# Patient Record
Sex: Female | Born: 1985 | Marital: Married
Health system: Southern US, Community
[De-identification: ages and names within clinical notes are randomized; demographics above are authoritative.]

## PROBLEM LIST (undated history)

## (undated) DIAGNOSIS — F419 Anxiety disorder, unspecified: Secondary | ICD-10-CM

## (undated) DIAGNOSIS — J860 Pyothorax with fistula: Secondary | ICD-10-CM

## (undated) DIAGNOSIS — F909 Attention-deficit hyperactivity disorder, unspecified type: Secondary | ICD-10-CM

## (undated) DIAGNOSIS — A630 Anogenital (venereal) warts: Secondary | ICD-10-CM

## (undated) DIAGNOSIS — N189 Chronic kidney disease, unspecified: Secondary | ICD-10-CM

## (undated) DIAGNOSIS — R87629 Unspecified abnormal cytological findings in specimens from vagina: Secondary | ICD-10-CM

## (undated) DIAGNOSIS — I10 Essential (primary) hypertension: Secondary | ICD-10-CM

## (undated) DIAGNOSIS — F329 Major depressive disorder, single episode, unspecified: Secondary | ICD-10-CM

## (undated) DIAGNOSIS — O09299 Supervision of pregnancy with other poor reproductive or obstetric history, unspecified trimester: Secondary | ICD-10-CM

## (undated) DIAGNOSIS — F32A Depression, unspecified: Secondary | ICD-10-CM

## (undated) DIAGNOSIS — O1420 HELLP syndrome (HELLP), unspecified trimester: Secondary | ICD-10-CM

## (undated) DIAGNOSIS — T7840XA Allergy, unspecified, initial encounter: Secondary | ICD-10-CM

## (undated) DIAGNOSIS — O139 Gestational [pregnancy-induced] hypertension without significant proteinuria, unspecified trimester: Secondary | ICD-10-CM

## (undated) HISTORY — DX: Allergy, unspecified, initial encounter: T78.40XA

## (undated) HISTORY — DX: Pyothorax with fistula: J86.0

## (undated) HISTORY — PX: TRACHEOESOPHAGEAL FISTULA REPAIR: SHX2557

## (undated) HISTORY — PX: VASCULAR RING REPAIR: SHX2651

## (undated) HISTORY — DX: Anxiety disorder, unspecified: F41.9

## (undated) HISTORY — DX: Depression, unspecified: F32.A

## (undated) HISTORY — PX: TRACHEAL SURGERY: SHX1096

## (undated) HISTORY — PX: ESOPHAGUS SURGERY: SHX626

## (undated) HISTORY — DX: Major depressive disorder, single episode, unspecified: F32.9

---

## 2005-04-13 ENCOUNTER — Emergency Department (HOSPITAL_COMMUNITY): Admission: EM | Admit: 2005-04-13 | Discharge: 2005-04-13 | Payer: Self-pay | Admitting: Family Medicine

## 2011-02-27 ENCOUNTER — Ambulatory Visit (INDEPENDENT_AMBULATORY_CARE_PROVIDER_SITE_OTHER): Payer: PRIVATE HEALTH INSURANCE

## 2011-02-27 DIAGNOSIS — R1310 Dysphagia, unspecified: Secondary | ICD-10-CM

## 2011-05-15 ENCOUNTER — Ambulatory Visit: Payer: PRIVATE HEALTH INSURANCE | Admitting: Physician Assistant

## 2011-05-15 VITALS — BP 120/83 | HR 82 | Temp 98.1°F | Resp 16 | Ht 65.0 in | Wt 144.2 lb

## 2011-05-15 DIAGNOSIS — J069 Acute upper respiratory infection, unspecified: Secondary | ICD-10-CM

## 2011-05-15 DIAGNOSIS — R05 Cough: Secondary | ICD-10-CM

## 2011-05-15 DIAGNOSIS — R059 Cough, unspecified: Secondary | ICD-10-CM

## 2011-05-15 DIAGNOSIS — J029 Acute pharyngitis, unspecified: Secondary | ICD-10-CM

## 2011-05-15 LAB — POCT CBC
Granulocyte percent: 60 %G (ref 37–80)
Lymph, poc: 1.4 (ref 0.6–3.4)
MCHC: 32.5 g/dL (ref 31.8–35.4)
MCV: 87.4 fL (ref 80–97)
MID (cbc): 0.4 (ref 0–0.9)
POC Granulocyte: 2.7 (ref 2–6.9)
POC LYMPH PERCENT: 30.8 %L (ref 10–50)
Platelet Count, POC: 206 10*3/uL (ref 142–424)
RDW, POC: 12.5 %

## 2011-05-15 MED ORDER — IPRATROPIUM BROMIDE 0.03 % NA SOLN: 2.0000 | Freq: Two times a day (BID) | NASAL | Status: AC

## 2011-05-15 MED ORDER — GUAIFENESIN ER 1200 MG PO TB12: 1.0000 | ORAL_TABLET | Freq: Two times a day (BID) | ORAL | Status: DC | PRN

## 2011-05-15 MED ORDER — HYDROCOD POLST-CHLORPHEN POLST 10-8 MG/5ML PO LQCR: 5.0000 mL | Freq: Two times a day (BID) | ORAL | Status: DC | PRN

## 2011-05-15 MED ORDER — ALBUTEROL SULFATE HFA 108 (90 BASE) MCG/ACT IN AERS: 2.0000 | INHALATION_SPRAY | RESPIRATORY_TRACT | Status: AC | PRN

## 2011-05-15 NOTE — Patient Instructions (Signed)
Get LOTS of rest.  Drink at least 64 ounces of water daily.  Use ibuprofen &/or acetaminophen for the muscle aches, and sore throat.

## 2011-05-15 NOTE — Progress Notes (Signed)
  Subjective:    Patient ID: Angelica [name], female    DOB: 1985-10-10, 26 y.o.   MRN: [id number]  HPI  Illness began 05/13/11.  Had been in [place] visiting with sister over spring break.  Awoke feeling like she had a cold.  Then developed severe body aches, fatigue, skin sensitivity (just the blankets on me hurt).  A little less achey today.  Cough, sore throat and congestion.  No GU/GI symtoms except decreased appetite.  School is going really well, "I love it."  Review of Systems As above.    Objective:   Physical Exam  Vital signs noted. Well-developed, well nourished WF who is awake, alert and oriented, in NAD. HEENT: [place]/AT, PERRL, EOMI.  Sclera and conjunctiva are clear.  EAC are patent, TMs are normal in appearance. Nasal mucosa is pink and moist. OP is clear. Neck: supple, non-tender, no lymphadenopathey, thyromegaly. Heart: RRR, no murmur Lungs: soft wheezes throughout. Abdomen: normo-active bowel sounds, supple, non-tender, no mass or organomegaly. Extremities: no cyanosis, clubbing or edema. Skin: warm and dry without rash.  Results for orders placed in visit on 05/15/11  POCT INFLUENZA A/B      Component Value Range   Influenza A, POC Negative     Influenza B, POC Negative    POCT RAPID STREP A (OFFICE)      Component Value Range   Rapid Strep A Screen Negative  Negative   POCT CBC      Component Value Range   WBC 4.5 (*) 4.6 - 10.2 (K/uL)   Lymph, poc 1.4  0.6 - 3.4    POC LYMPH PERCENT 30.8  10 - 50 (%L)   MID (cbc) 0.4  0 - 0.9    POC MID % 9.2  0 - 12 (%M)   POC Granulocyte 2.7  2 - 6.9    Granulocyte percent 60.0  37 - 80 (%G)   RBC 5.10  4.04 - 5.48 (M/uL)   Hemoglobin 14.5  12.2 - 16.2 (g/dL)   HCT, POC 10.2  72.5 - 47.9 (%)   MCV 87.4  80 - 97 (fL)   MCH, POC 28.4  27 - 31.2 (pg)   MCHC 32.5  31.8 - 35.4 (g/dL)   RDW, POC 36.6     Platelet Count, POC 206  142 - 424 (K/uL)   MPV 9.2  0 - 99.8 (fL)        Assessment & Plan:  Pharyngitis,  cough, wheezing. Likely viral illness, likely flu, despite negative test.  Supportive, symptomatic care. Anticipatory guidance provided. Mucinex, Atrovent, albuterol inhaler, Tussionex.  Out of school today.

## 2012-05-12 ENCOUNTER — Ambulatory Visit (INDEPENDENT_AMBULATORY_CARE_PROVIDER_SITE_OTHER): Payer: BC Managed Care – PPO | Admitting: Emergency Medicine

## 2012-05-12 VITALS — BP 144/100 | HR 98 | Temp 98.2°F | Resp 16 | Ht 65.0 in | Wt 139.0 lb

## 2012-05-12 DIAGNOSIS — K047 Periapical abscess without sinus: Secondary | ICD-10-CM

## 2012-05-12 MED ORDER — PENICILLIN V POTASSIUM 500 MG PO TABS: 500.0000 mg | ORAL_TABLET | Freq: Four times a day (QID) | ORAL | Status: DC

## 2012-05-12 MED ORDER — HYDROCODONE-ACETAMINOPHEN 5-325 MG PO TABS: 1.0000 | ORAL_TABLET | ORAL | Status: DC | PRN

## 2012-05-12 NOTE — Patient Instructions (Addendum)
Dental Caries   Tooth decay (dental caries, cavities) is the most common of all oral diseases. It occurs in all ages but is more common in children and young adults.   CAUSES   Bacteria in your mouth combine with foods (particularly sugars and starches) to produce plaque. Plaque is a substance that sticks to the hard surfaces of teeth. The bacteria in the plaque produce acids that attack the enamel of teeth. Repeated acid attacks dissolve the enamel and create holes in the teeth. Root surfaces of teeth may also get these holes.   Other contributing factors include:    Frequent snacking and drinking of cavity-producing foods and liquids.   Poor oral hygiene.   Dry mouth.   Substance abuse such as methamphetamine.   Broken or poor fitting dental restorations.   Eating disorders.   Gastroesophageal reflux disease (GERD).   Certain radiation treatments to the head and neck.  SYMPTOMS   At first, dental decay appears as white, chalky areas on the enamel. In this early stage, symptoms are seldom present. As the decay progresses, pits and holes may appear on the enamel surfaces. Progression of the decay will lead to softening of the hard layers of the tooth. At this point you may experience some pain or achy feeling after sweet, hot, or cold foods or drinks are consumed. If left untreated, the decay will reach the internal structures of the tooth and produce severe pain. Extensive dental treatment, such as root canal therapy, may be needed to save the tooth at this late stage of decay development.   DIAGNOSIS   Most cavities will be detected during regular check-ups. A thorough medical and dental history will be taken by the dentist. The dentist will use instruments to check the surfaces of your teeth for any breakdown or discoloration. Some dentists have special instruments, such as lasers, that detect tooth decay. Dental X-rays may also show some cavities that are not visible to the eye (such as between the  contact areas of the teeth).  TREATMENT   Treatment involves removal of the tooth decay and replacement with a restorative material such as silver, gold, or composite (white) material. However, if the decay involves a large area of the tooth and there is little remaining healthy tooth structure, a cap (crown) will be fitted over the remaining structure. If the decay involves the center part of the tooth (pulp), root canal treatment will be needed before any type of dental restoration is placed. If the tooth is severely destroyed by the decay process, leaving the remaining tooth structures unrestorable, the tooth will need to be pulled (extracted). Some early tooth decay may be reversed by fluoride treatments and thorough brushing and flossing at home.  PREVENTION    Eat healthy foods. Restrict the amount of sugary, starchy foods and liquids you consume. Avoid frequent snacking and drinking of unhealthy foods and liquids.   Sealants can help with prevention of cavities. Sealants are composite resins applied onto the biting surfaces of teeth at risk for decay. They smooth out the pits and grooves and prevent food from being trapped in them. This is done in early childhood before tooth decay has started.   Fluoride tablets may also be prescribed to children between 6 months and 10 years of age if your drinking water is not fluoridated. The fluoride absorbed by the tooth enamel makes teeth less susceptible to decay. Thorough daily cleaning with a toothbrush and dental floss is the best way to prevent   cavities. Use of a fluoride toothpaste is highly recommended. Fluoride mouth rinses may be used in specific cases.   Topical application of fluoride by your dentist is important in children.   Regular visits with a dentist for checkups and cleanings are also important.  SEEK IMMEDIATE DENTAL CARE IF:   You have a fever.   You develop redness and swelling of your face, jaw, or neck.   You develop swelling around a  tooth.   You are unable to open your mouth or cannot swallow.   You have severe pain uncontrolled by pain medicine.  Document Released: 11/09/2001 Document Revised: 05/12/2011 Document Reviewed: 07/25/2010  ExitCare Patient Information 2013 [employer].

## 2012-05-12 NOTE — Progress Notes (Signed)
[facility] [address], [place] [address] [phone]  Date:  05/12/2012   Name:  Siniya Lichty   DOB:  November 27, 1985   MRN:  [id number]  PCP:  No primary provider on file.    Chief Complaint: Dental Pain   History of Present Illness:  Angelica [name] is a 27 y.o. very pleasant female patient who presents with the following:  Says that a filling fell out and she has been having very significant pain in her right mandibular molar when exposed to hot or cold drinks or attempting to eat. Denies facial cellulitis or swelling.  No fever or chills.  Denies injury.  Has an appt with dentist Monday.  There is no problem list on file for this patient.   Past Medical History  Diagnosis Date  . Allergy   . Depression   . Anxiety     Past Surgical History  Procedure Laterality Date  . Esophagus surgery    . Tracheal surgery      History  Substance Use Topics  . Smoking status: Former Games developer  . Smokeless tobacco: Never Used  . Alcohol Use: 1.8 oz/week    3 Glasses of wine per week    Family History  Problem Relation Age of Onset  . Diabetes Maternal Grandfather   . Stroke Paternal Grandmother     Allergies  Allergen Reactions  . Latex Anaphylaxis and Hives    Medication list has been reviewed and updated.  Current Outpatient Prescriptions on File Prior to Visit  Medication Sig Dispense Refill  . ALPRAZolam (XANAX) 0.5 MG tablet Take 0.5 mg by mouth at bedtime as needed.      Marland Kitchen lisdexamfetamine (VYVANSE) 30 MG capsule Take 30 mg by mouth every morning.      . lithium carbonate 300 MG capsule Take 300 mg by mouth 3 (three) times daily with meals.      . sertraline (ZOLOFT) 100 MG tablet Take 100 mg by mouth daily. 1 1/2 tablets per day      . albuterol (PROVENTIL HFA;VENTOLIN HFA) 108 (90 BASE) MCG/ACT inhaler Inhale 2 puffs into the lungs every 4 (four) hours as needed for wheezing (cough, shortness of breath or wheezing.).  1 Inhaler  1   . amphetamine-dextroamphetamine (ADDERALL) 10 MG tablet Take 10 mg by mouth daily as needed.       . chlorpheniramine-HYDROcodone (TUSSIONEX PENNKINETIC ER) 10-8 MG/5ML LQCR Take 5 mLs by mouth every 12 (twelve) hours as needed (cough).  60 mL  0  . Guaifenesin (MUCINEX MAXIMUM STRENGTH) 1200 MG TB12 Take 1 tablet (1,200 mg total) by mouth every 12 (twelve) hours as needed.  14 tablet  1  . ipratropium (ATROVENT) 0.03 % nasal spray Place 2 sprays into the nose 2 (two) times daily.  30 mL  0   No current facility-administered medications on file prior to visit.    Review of Systems:  As per HPI, otherwise negative.    Physical Examination: Filed Vitals:   05/12/12 1609  BP: 144/100  Pulse: 98  Temp: 98.2 F (36.8 C)  Resp: 16   Filed Vitals:   05/12/12 1609  Height: 5\' 5"  (1.651 m)  Weight: 139 lb (63.05 kg)   Body mass index is 23.13 kg/(m^2). Ideal Body Weight: Weight in (lb) to have BMI = 25: 149.9   GEN: WDWN, NAD, Non-toxic, Alert & Oriented x 3 HEENT: Atraumatic, Normocephalic.  Ears and Nose: No external deformity. EXTR: No clubbing/cyanosis/edema NEURO:  Normal gait.  PSYCH: Normally interactive. Conversant. Not depressed or anxious appearing.  Calm demeanor.  MOUTH:  Missing filling # 30.  No cellulitis or gingivitis  Assessment and Plan: Periapical abscess Pen v k vicodin  Carmelina Dane, MD

## 2013-01-31 HISTORY — PX: DILATION AND CURETTAGE OF UTERUS: SHX78

## 2013-02-14 ENCOUNTER — Ambulatory Visit: Payer: BC Managed Care – PPO | Admitting: Physician Assistant

## 2013-02-14 VITALS — BP 120/80 | HR 90 | Temp 98.3°F | Ht 65.0 in | Wt 157.2 lb

## 2013-02-14 DIAGNOSIS — F32A Depression, unspecified: Secondary | ICD-10-CM | POA: Insufficient documentation

## 2013-02-14 DIAGNOSIS — F329 Major depressive disorder, single episode, unspecified: Secondary | ICD-10-CM | POA: Insufficient documentation

## 2013-02-14 DIAGNOSIS — F988 Other specified behavioral and emotional disorders with onset usually occurring in childhood and adolescence: Secondary | ICD-10-CM | POA: Insufficient documentation

## 2013-02-14 DIAGNOSIS — F341 Dysthymic disorder: Secondary | ICD-10-CM

## 2013-02-14 DIAGNOSIS — J309 Allergic rhinitis, unspecified: Secondary | ICD-10-CM | POA: Insufficient documentation

## 2013-02-14 MED ORDER — SERTRALINE HCL 100 MG PO TABS: 200.0000 mg | ORAL_TABLET | Freq: Every day | ORAL | Status: DC

## 2013-02-14 NOTE — Progress Notes (Signed)
   Subjective:    Patient ID: Angelica [name], female    DOB: 06-26-1985, 27 y.o.   MRN: [id number]  PCP: No PCP Per Patient  Chief Complaint  Patient presents with  . Medication Refill    need Zoloft 200 mg   Medications, allergies, past medical history, surgical history, family history, social history and problem list reviewed and updated.  HPI  "It's going really really well."  Is happy with school and relationships. No thoughts of harming herself or others.  Is seeing a specialist at [facility] (Dr. Jennelle Human), but she'll run out of her prescription before she gets seen there, and they won't refill the prescription without an OV.  I haven't seen her since 05/2011.  She terminated an unplanned pregnancy about 2 weeks ago.  Has a follow-up US later this week and then will resume coc for pregnancy prevention.  Review of Systems     Objective:   Physical Exam Blood pressure 120/80, pulse 90, temperature 98.3 F (36.8 C), temperature source Oral, height 5\' 5"  (1.651 m), weight 157 lb 3.2 oz (71.305 kg), SpO2 100.00%. Body mass index is 26.16 kg/(m^2). Well-developed, well nourished WF who is awake, alert and oriented, in NAD. HEENT: [place]/AT, sclera and conjunctiva are clear.   Neck: supple, non-tender, no lymphadenopathy, thyromegaly. Heart: RRR, no murmur Lungs: normal effort, CTA Skin: warm and dry without rash. Psychologic: good mood and appropriate affect, normal speech and behavior.        Assessment & Plan:  1. Anxiety and depression Stable.  Continue current treatment and follow-up with Dr. Jennelle Human as planned. - sertraline (ZOLOFT) 100 MG tablet; Take 2 tablets (200 mg total) by mouth daily.  Dispense: 60 tablet; Refill: 2   Fernande Bras, PA-C Physician Assistant-Certified [facility]

## 2013-02-14 NOTE — Patient Instructions (Signed)
Follow-up with Dr. Jennelle Human as planned.  Consider reading Attention Deficit Disorder: The Unfocused Mind in Children and Adults by Berneta Levins

## 2015-01-08 LAB — HEPATIC FUNCTION PANEL
ALK PHOS: 57 U/L (ref 25–125)
ALT: 12 U/L (ref 7–35)
AST: 14 U/L (ref 13–35)
Bilirubin, Total: 0.5 mg/dL

## 2015-01-08 LAB — TSH: TSH: 1.31 u[IU]/mL (ref 0.41–5.90)

## 2015-01-08 LAB — CBC AND DIFFERENTIAL
HCT: 41 % (ref 36–46)
HEMOGLOBIN: 14.4 g/dL (ref 12.0–16.0)
Neutrophils Absolute: 6 /uL
PLATELETS: 218 10*3/uL (ref 150–399)
WBC: 7.7 10^3/mL

## 2015-01-08 LAB — BASIC METABOLIC PANEL
BUN: 12 mg/dL (ref 4–21)
CREATININE: 1.2 mg/dL — AB (ref 0.5–1.1)
Glucose: 103 mg/dL
Potassium: 3.8 mmol/L (ref 3.4–5.3)
SODIUM: 141 mmol/L (ref 137–147)

## 2015-01-08 LAB — HEMOGLOBIN A1C: Hgb A1c MFr Bld: 5.2 % (ref 4.0–6.0)

## 2015-01-08 LAB — HM PAP SMEAR: HM Pap smear: NEGATIVE

## 2015-02-06 ENCOUNTER — Ambulatory Visit (INDEPENDENT_AMBULATORY_CARE_PROVIDER_SITE_OTHER): Payer: BLUE CROSS/BLUE SHIELD | Admitting: Physician Assistant

## 2015-02-06 ENCOUNTER — Encounter: Payer: Self-pay | Admitting: Physician Assistant

## 2015-02-06 VITALS — BP 90/70 | HR 103 | Temp 99.3°F | Resp 16 | Ht 65.0 in | Wt 161.6 lb

## 2015-02-06 DIAGNOSIS — Z23 Encounter for immunization: Secondary | ICD-10-CM

## 2015-02-06 DIAGNOSIS — Z711 Person with feared health complaint in whom no diagnosis is made: Secondary | ICD-10-CM

## 2015-02-06 DIAGNOSIS — Z283 Underimmunization status: Secondary | ICD-10-CM

## 2015-02-06 NOTE — Progress Notes (Signed)
Patient ID: Angelica [name], female    DOB: 1985-11-13, 29 y.o.   MRN: [id number]  PCP: No PCP Per Patient  Subjective:   Chief Complaint  Patient presents with  . Follow-up  . labs from GYN    HPI Presents for evaluation of lab results performed at GYN. I last saw her 24 months ago. She is accompanied by her sister today, who is visiting before moving to [place], [place].  She is particularly concerned that the serum creatinine was elevated (1.19) and that she may have kidney failure.  Labs reviewed together and abstracted into her record for future comparison. Originals returned to her.  Other than terrible anxiety about the kidney function, she feels well. She is very happy with her psychiatrist. Sees a therapist intermittently.  She is engaged to be married in May. Currently lives with her fiance. Rather than a big wedding, they are saving money to buy a home, and the wedding planning was too stressful. 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   Review of Systems As above.    Patient Active Problem List   Diagnosis Date Noted  . Anxiety and depression 02/14/2013  . ADD (attention deficit disorder) 02/14/2013  . Allergic rhinitis 02/14/2013     Prior to Admission medications   Medication Sig Start Date End Date Taking? Authorizing Provider  busPIRone (BUSPAR) 15 MG tablet Take 15 mg by mouth 3 (three) times daily.   Yes Historical Provider, MD  cloNIDine (CATAPRES) 0.2 MG tablet Take 0.2 mg by mouth 2 (two) times daily.   Yes Historical Provider, MD  dexmethylphenidate (FOCALIN) 10 MG tablet Take 10 mg by mouth 2 (two) times daily.   Yes Historical Provider, MD  Dexmethylphenidate HCl 40 MG CP24 Take by mouth.   Yes Historical Provider, MD  folic acid (FOLVITE) 1 MG tablet Take 1 mg by mouth daily.   Yes Historical Provider, MD  levonorgestrel  (MIRENA) 20 MCG/24HR IUD 1 each by Intrauterine route once.   Yes Historical Provider, MD  lithium carbonate 300 MG capsule Take 300 mg by mouth 3 (three) times daily with meals.   Yes Historical Provider, MD  LORazepam (ATIVAN) 0.5 MG tablet Take 0.5 mg by mouth every 8 (eight) hours.   Yes Historical Provider, MD  sertraline (ZOLOFT) 100 MG tablet Take 2 tablets (200 mg total) by mouth daily. 02/14/13  Yes Trica Usery, PA-C  ALPRAZolam (XANAX) 0.5 MG tablet Take 0.5 mg by mouth at bedtime as needed.    Historical Provider, MD  desogestrel-ethinyl estradiol (KARIVA,AZURETTE,MIRCETTE) 0.15-0.02/0.01 MG (21/5) tablet Take 1 tablet by mouth daily.    Historical Provider, MD  lisdexamfetamine (VYVANSE) 30 MG capsule Take 30 mg by mouth every morning.    Historical Provider, MD     Allergies  Allergen Reactions  . Latex Anaphylaxis and Hives       Objective:  Physical Exam  Constitutional: She is oriented to person, place, and time. Vital signs are normal. She appears well-developed and well-nourished. She is active and cooperative. No distress.  BP 90/70 mmHg  Pulse 103  Temp(Src) 99.3 F (37.4 C) (Oral)  Resp 16  Ht 5\' 5"  (1.651 m)  Wt 161 lb 9.6 oz (73.301 kg)  BMI 26.89 kg/m2  SpO2 98%  LMP 01/31/2015  HENT:  Head: Normocephalic and atraumatic.  Right Ear: Hearing normal.  Left Ear: Hearing normal.  Eyes: Conjunctivae are normal. No scleral icterus.  Neck: Normal range of motion. Neck supple. No thyromegaly present.  Cardiovascular: Normal rate, regular rhythm and normal heart sounds.   Pulses:      Radial pulses are 2+ on the right side, and 2+ on the left side.  Pulmonary/Chest: Effort normal and breath sounds normal.  Lymphadenopathy:       Head (right side): No tonsillar, no preauricular, no posterior auricular and no occipital adenopathy present.       Head (left side): No tonsillar, no preauricular, no posterior auricular and no occipital adenopathy present.    She  has no cervical adenopathy.       Right: No supraclavicular adenopathy present.       Left: No supraclavicular adenopathy present.  Neurological: She is alert and oriented to person, place, and time. No sensory deficit.  Skin: Skin is warm, dry and intact. No rash noted. No cyanosis or erythema. Nails show no clubbing.  Psychiatric: Her speech is normal and behavior is normal. Judgment and thought content normal. Her mood appears anxious. Her affect is not angry, not blunt, not labile and not inappropriate. Cognition and memory are normal. She does not exhibit a depressed mood.  Initially in tears with anxiety over the abnormal lab results. Later is mildly anxious, appropriate, and smiling.           Assessment & Plan:   1. Worried well Discussed that the other measures of renal function are normal and that this mild elevation, alone, is not worrisome. We agree to repeat it in 6-12 months, sooner if she develops any new symptoms or begins any new medications that are renally excreted.  2. Need for Tdap vaccination - Tdap vaccine greater than or equal to 7yo IM  3. Need for influenza vaccination Initially declined, then agreed, then declined again. Encouraged her to reconsider.   Fara Chute, PA-C Physician Assistant-Certified [facility]

## 2015-02-14 ENCOUNTER — Encounter: Payer: Self-pay | Admitting: Physician Assistant

## 2015-02-14 DIAGNOSIS — Z789 Other specified health status: Secondary | ICD-10-CM | POA: Insufficient documentation

## 2015-11-16 ENCOUNTER — Telehealth: Payer: Self-pay | Admitting: Emergency Medicine

## 2015-11-16 NOTE — Telephone Encounter (Signed)
Spoke with pts Father in law, Dr Ron Parker, pt is having issue with high BP and would like to get established with Dr Quay Burow. LVM for pt to call back to schedule appt. Okay with Dr Quay Burow to work pt in.

## 2015-11-27 ENCOUNTER — Encounter: Payer: Self-pay | Admitting: Cardiology

## 2015-11-27 ENCOUNTER — Encounter: Payer: Self-pay | Admitting: Internal Medicine

## 2015-11-27 ENCOUNTER — Ambulatory Visit (INDEPENDENT_AMBULATORY_CARE_PROVIDER_SITE_OTHER): Payer: 59 | Admitting: Internal Medicine

## 2015-11-27 VITALS — BP 152/100 | HR 91 | Temp 98.3°F | Resp 16 | Wt 174.0 lb

## 2015-11-27 DIAGNOSIS — F419 Anxiety disorder, unspecified: Secondary | ICD-10-CM

## 2015-11-27 DIAGNOSIS — R030 Elevated blood-pressure reading, without diagnosis of hypertension: Secondary | ICD-10-CM

## 2015-11-27 DIAGNOSIS — IMO0001 Reserved for inherently not codable concepts without codable children: Secondary | ICD-10-CM

## 2015-11-27 DIAGNOSIS — F418 Other specified anxiety disorders: Secondary | ICD-10-CM | POA: Diagnosis not present

## 2015-11-27 DIAGNOSIS — F329 Major depressive disorder, single episode, unspecified: Secondary | ICD-10-CM

## 2015-11-27 DIAGNOSIS — F988 Other specified behavioral and emotional disorders with onset usually occurring in childhood and adolescence: Secondary | ICD-10-CM

## 2015-11-27 DIAGNOSIS — F909 Attention-deficit hyperactivity disorder, unspecified type: Secondary | ICD-10-CM | POA: Diagnosis not present

## 2015-11-27 MED ORDER — AMPHETAMINE-DEXTROAMPHETAMINE 10 MG PO TABS: ORAL_TABLET | ORAL | 0 refills | Status: DC

## 2015-11-27 MED ORDER — FOLIC ACID 1 MG PO TABS: 1.0000 mg | ORAL_TABLET | Freq: Every day | ORAL | 3 refills | Status: DC

## 2015-11-27 NOTE — Patient Instructions (Addendum)
  Test(s) ordered today. Your results will be released to MyChart (or called to you) after review, usually within 72hours after test completion. If any changes need to be made, you will be notified at that same time.   Medications reviewed and updated.  No changes recommended at this time.    Please followup in 4 weeks   

## 2015-11-27 NOTE — Assessment & Plan Note (Signed)
Management per psych Medications being revised so that she can get pregnant in the spring

## 2015-11-27 NOTE — Assessment & Plan Note (Signed)
BP consistently elevated Discussed concern with elevated BP - her BP has not been elevated long term so I think it is reasonable to rule out other causes and work on lifestyle before starting medication Start regular exercise, work on weight loss, low sodium diet Monitor BP at home Check labs Follow up in 4 weeks, sooner if needed

## 2015-11-27 NOTE — Progress Notes (Signed)
Patient received education resource, including the self-management goal and tool. Patient verbalized understanding. 

## 2015-11-27 NOTE — Assessment & Plan Note (Signed)
Management per psych Discussed concern that the Adderall may be causing her elevated BP - will discuss with psych

## 2015-11-27 NOTE — Progress Notes (Signed)
Subjective:    Patient ID: EDONA RINEY, female    DOB: 11-May-1985, 30 y.o.   MRN: [id number]  HPI She is here to establish with a new pcp.    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    She drinks two glasses of wine a night.  Initially she was going this as self medication for her anxiety and ADD, but the medication she is on now is helping and she has decreased her alcohol intake.  She does not feel she is drinking to relax now or self medicate.    She does not exercise - she hates exercise.  She does go to the gym sometimes.  Once she gets to the gym is she ok, but she does not have it in her routine.   Elevated blood pressure:  She sees her psychiatrist noticed her BP was elevated and her husband has been monitoring it at home.  It has been consistently high.  She has tried to decrease the salt in her diet.  Her BP at home 110-138/80-108.  She has gained weight and knows she needs to lose weight.   Medications and allergies reviewed with patient and updated if appropriate.  Patient Active Problem List   Diagnosis Date Noted  . Immune to hepatitis B 02/14/2015  . Anxiety and depression 02/14/2013  . ADD (attention deficit disorder) 02/14/2013  . Allergic rhinitis 02/14/2013    Current Outpatient Prescriptions on File Prior to Visit  Medication Sig Dispense Refill  . busPIRone (BUSPAR) 15 MG tablet Take 15 mg by mouth 3 (three) times daily.    . cloNIDine (CATAPRES) 0.2 MG tablet Take 0.2 mg by mouth 2 (two) times daily.    . folic acid (FOLVITE) 1 MG tablet Take 1 mg by mouth daily.    Marland Kitchen levonorgestrel (MIRENA) 20 MCG/24HR IUD 1 each by Intrauterine route once.    Marland Kitchen LORazepam (ATIVAN) 0.5 MG tablet Take 0.5 mg by mouth every 8 (eight) hours.    . sertraline (ZOLOFT) 100 MG tablet Take 2 tablets (200 mg total) by mouth daily. 60 tablet 2   No current facility-administered medications on file  prior to visit.     Past Medical History:  Diagnosis Date  . Allergy   . Anxiety   . Depression   . Tracheoesophageal fistula [facility])     Past Surgical History:  Procedure Laterality Date  . DILATION AND CURETTAGE OF UTERUS  01/2013  . ESOPHAGUS SURGERY    . TRACHEAL SURGERY      Social History   Social History  . Marital status: Single    Spouse name: n/a  . Number of children: 0  . Years of education: Master's   Occupational History  . 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 [employer]     private practice   Social History Main Topics  . Smoking status: Former Research scientist (life sciences)  . Smokeless tobacco: Never Used  . Alcohol use 3.0 - 6.0 oz/week    5 - 10 Standard drinks or equivalent per week     Comment: "more than I should, it's a stupid coping mechanism."  . Drug use: No  . Sexual activity: Yes    Partners: Male    Birth control/ protection: Pill   Other Topics Concern  . None   Social History Narrative   Graduated from [employer]  2008.   Lives with her fiance.   Engaged to be married in May 2017.          Family History  Problem Relation Age of Onset  . Diabetes Maternal Grandfather   . Stroke Paternal Grandmother   . Hyperlipidemia Paternal Grandmother   . Mental illness Mother   . Mental illness Sister   . Cancer Maternal Grandmother     Review of Systems  Constitutional: Negative for fever.  HENT: Positive for trouble swallowing (rare if she does not chew enough).   Eyes: Negative for visual disturbance.  Respiratory: Negative for cough, shortness of breath and wheezing.   Cardiovascular: Positive for chest pain (anxiety only) and palpitations (anxiety related). Negative for leg swelling.  Gastrointestinal: Negative for abdominal pain and nausea.  Endocrine: Negative for polydipsia and polyuria.  Genitourinary: Negative for dysuria and hematuria.  Neurological: Negative for dizziness, light-headedness and  headaches.  Psychiatric/Behavioral: Positive for dysphoric mood. The patient is nervous/anxious.        Objective:   Vitals:   11/27/15 1609  BP: (!) 152/100  Pulse: 91  Resp: 16  Temp: 98.3 F (36.8 C)   Filed Weights   11/27/15 1609  Weight: 174 lb (78.9 kg)   Body mass index is 28.96 kg/m.   Physical Exam Constitutional: She appears well-developed and well-nourished. No distress.  HENT:  Head: Normocephalic and atraumatic.  Right Ear: External ear normal. Left Ear: External ear normal.   Mouth/Throat: Oropharynx is clear and moist.  Eyes: Conjunctivae normal.  Neck: Neck supple. No tracheal deviation present. No thyromegaly present.  No carotid bruit  Cardiovascular: Normal rate, regular rhythm and normal heart sounds.   No murmur heard.  No edema. Pulmonary/Chest: Effort normal and breath sounds normal. No respiratory distress. She has no wheezes. She has no rales.  Abdominal: Soft. She exhibits no distension. There is no tenderness.  Lymphadenopathy: She has no cervical adenopathy.  Skin: Skin is warm and dry. She is not diaphoretic.  Psychiatric: She has a normal mood and affect. Her behavior is normal.      Assessment & Plan:   See Problem List for Assessment and Plan of chronic medical problems.   Follow up in 4 weeks

## 2015-12-05 ENCOUNTER — Ambulatory Visit: Payer: Self-pay | Admitting: Nurse Practitioner

## 2015-12-23 NOTE — Progress Notes (Signed)
Subjective:    Patient ID: Angelica [name], female    DOB: June 10, 1985, 30 y.o.   MRN: [id number]  HPI She is here for follow up.  She has been eating very healthy.  She has decreased her salt intake.  She has lost 5 lbs since she was here last.  She is not exercising regularly, but not regularly. She is active with yard work.   Elevated blood pressure:  Her husband has been checking her BP (he is a  Audiological scientist) - it was normal prior to coffee and Adderall.  She is unsure if it is elevated after taking the Adderall.  She thinks it has been high on other ADD medications.  She feels she still needs the Adderall.  She plans on coming off eventually because she still wants to get pregnant next year.   She has not decreased her alcohol intake.  She still drinks 2 glasses of wine a night.   She has not taken the ativan in a while and feels her anxiety is ok right now.     Medications and allergies reviewed with patient and updated if appropriate.  Patient Active Problem List   Diagnosis Date Noted  . Elevated blood pressure 11/27/2015  . Immune to hepatitis B 02/14/2015  . Anxiety and depression 02/14/2013  . ADD (attention deficit disorder) 02/14/2013  . Allergic rhinitis 02/14/2013    Current Outpatient Prescriptions on File Prior to Visit  Medication Sig Dispense Refill  . amphetamine-dextroamphetamine (ADDERALL) 10 MG tablet Adderall 30 mg in morning and 10-20 mg in afternoon 60 tablet 0  . busPIRone (BUSPAR) 15 MG tablet Take 15 mg by mouth 3 (three) times daily.    . cloNIDine (CATAPRES) 0.2 MG tablet Take 0.2 mg by mouth 2 (two) times daily.    . folic acid (FOLVITE) 1 MG tablet Take 1 tablet (1 mg total) by mouth daily. 90 tablet 3  . levonorgestrel (MIRENA) 20 MCG/24HR IUD 1 each by Intrauterine route once.    Marland Kitchen LORazepam (ATIVAN) 0.5 MG tablet Take 0.5 mg by mouth every 8 (eight) hours.    . sertraline (ZOLOFT) 100 MG tablet Take 2 tablets (200 mg total) by mouth daily.  60 tablet 2   No current facility-administered medications on file prior to visit.     Past Medical History:  Diagnosis Date  . Allergy   . Anxiety   . Depression   . Tracheoesophageal fistula [facility])     Past Surgical History:  Procedure Laterality Date  . DILATION AND CURETTAGE OF UTERUS  01/2013  . ESOPHAGUS SURGERY    . TRACHEAL SURGERY      Social History   Social History  . Marital status: Single    Spouse name: n/a  . Number of children: 0  . Years of education: Master's   Occupational History  . 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 [employer]     private practice   Social History Main Topics  . Smoking status: Former Research scientist (life sciences)  . Smokeless tobacco: Never Used  . Alcohol use 3.0 - 6.0 oz/week    5 - 10 Standard drinks or equivalent per week     Comment: "more than I should, it's a stupid coping mechanism."  . Drug use: No  . Sexual activity: Yes    Partners: Male    Birth control/ protection: Pill   Other Topics Concern  . Not on file  Social History Narrative   Graduated from [employer] 2008.   Lives with her fiance.   Engaged to be married in May 2017.          Family History  Problem Relation Age of Onset  . Diabetes Maternal Grandfather   . Stroke Paternal Grandmother   . Hyperlipidemia Paternal Grandmother   . Mental illness Mother   . Mental illness Sister   . Cancer Maternal Grandmother     Review of Systems  Constitutional: Negative for diaphoresis and fever.       Energy level fluctuates, slightly improved  Respiratory: Negative for shortness of breath.   Cardiovascular: Negative for chest pain, palpitations and leg swelling.  Neurological: Negative for light-headedness and headaches.  Psychiatric/Behavioral: The patient is nervous/anxious.        Objective:   Vitals:   12/24/15 0929  BP: 124/82  Pulse: 76  Resp: 16  Temp: 98.5 F (36.9 C)   Filed Weights   12/24/15 0929    Weight: 169 lb (76.7 kg)   Body mass index is 28.12 kg/m.   Physical Exam Constitutional: Appears well-developed and well-nourished. No distress.  HENT:  Head: Normocephalic and atraumatic.  Neck: Neck supple. No tracheal deviation present. No thyromegaly present.  No cervical lymphadenopathy Cardiovascular: Normal rate, regular rhythm and normal heart sounds.   1/6 systolic murmur left sternal border with radiation to carotid. No carotid bruit .  No edema Pulmonary/Chest: Effort normal and breath sounds normal. No respiratory distress. No has no wheezes. No rales.  Skin: Skin is warm and dry. Not diaphoretic.  Psychiatric: slightly anxious mood and affect. Behavior is normal.       Assessment & Plan:   See Problem List for Assessment and Plan of chronic medical problems.

## 2015-12-24 ENCOUNTER — Encounter: Payer: Self-pay | Admitting: Internal Medicine

## 2015-12-24 ENCOUNTER — Other Ambulatory Visit (INDEPENDENT_AMBULATORY_CARE_PROVIDER_SITE_OTHER): Payer: 59

## 2015-12-24 ENCOUNTER — Ambulatory Visit (INDEPENDENT_AMBULATORY_CARE_PROVIDER_SITE_OTHER): Payer: 59 | Admitting: Internal Medicine

## 2015-12-24 VITALS — BP 124/82 | HR 76 | Temp 98.5°F | Resp 16 | Ht 65.0 in | Wt 169.0 lb

## 2015-12-24 DIAGNOSIS — IMO0001 Reserved for inherently not codable concepts without codable children: Secondary | ICD-10-CM

## 2015-12-24 DIAGNOSIS — R011 Cardiac murmur, unspecified: Secondary | ICD-10-CM

## 2015-12-24 DIAGNOSIS — R030 Elevated blood-pressure reading, without diagnosis of hypertension: Secondary | ICD-10-CM

## 2015-12-24 DIAGNOSIS — Z23 Encounter for immunization: Secondary | ICD-10-CM | POA: Diagnosis not present

## 2015-12-24 LAB — COMPREHENSIVE METABOLIC PANEL
ALBUMIN: 4.6 g/dL (ref 3.5–5.2)
ALT: 12 U/L (ref 0–35)
AST: 16 U/L (ref 0–37)
Alkaline Phosphatase: 56 U/L (ref 39–117)
BUN: 14 mg/dL (ref 6–23)
CALCIUM: 9.8 mg/dL (ref 8.4–10.5)
CHLORIDE: 108 meq/L (ref 96–112)
CO2: 26 meq/L (ref 19–32)
Creatinine, Ser: 1.03 mg/dL (ref 0.40–1.20)
GFR: 66.26 mL/min (ref >60.00)
Glucose, Bld: 88 mg/dL (ref 70–99)
POTASSIUM: 4.5 meq/L (ref 3.5–5.1)
Sodium: 140 mEq/L (ref 135–145)
Total Bilirubin: 0.6 mg/dL (ref 0.2–1.2)
Total Protein: 7.1 g/dL (ref 6.0–8.3)

## 2015-12-24 LAB — CBC WITH DIFFERENTIAL/PLATELET
BASOS PCT: 0.4 % (ref 0.0–3.0)
Basophils Absolute: 0 10*3/uL (ref 0.0–0.1)
EOS PCT: 3.6 % (ref 0.0–5.0)
Eosinophils Absolute: 0.2 10*3/uL (ref 0.0–0.7)
HEMATOCRIT: 44 % (ref 36.0–46.0)
HEMOGLOBIN: 15.2 g/dL — AB (ref 12.0–15.0)
Lymphocytes Relative: 28.8 % (ref 12.0–46.0)
Lymphs Abs: 1.7 10*3/uL (ref 0.7–4.0)
MCHC: 34.6 g/dL (ref 30.0–36.0)
MCV: 85.6 fl (ref 78.0–100.0)
MONO ABS: 0.3 10*3/uL (ref 0.1–1.0)
Monocytes Relative: 5.6 % (ref 3.0–12.0)
NEUTROS ABS: 3.6 10*3/uL (ref 1.4–7.7)
Neutrophils Relative %: 61.6 % (ref 43.0–77.0)
PLATELETS: 192 10*3/uL (ref 150.0–400.0)
RBC: 5.14 Mil/uL — ABNORMAL HIGH (ref 3.87–5.11)
RDW: 12.2 % (ref 11.5–15.5)
WBC: 5.9 10*3/uL (ref 4.0–10.5)

## 2015-12-24 LAB — LIPID PANEL
CHOL/HDL RATIO: 2
CHOLESTEROL: 158 mg/dL (ref 0–200)
HDL: 65.3 mg/dL (ref >39.00)
LDL CALC: 77 mg/dL (ref 0–99)
NonHDL: 92.66
TRIGLYCERIDES: 78 mg/dL (ref 0.0–149.0)
VLDL: 15.6 mg/dL (ref 0.0–40.0)

## 2015-12-24 LAB — CORTISOL: Cortisol, Plasma: 6.3 ug/dL

## 2015-12-24 LAB — TSH: TSH: 0.87 u[IU]/mL (ref 0.35–4.50)

## 2015-12-24 MED ORDER — EPINEPHRINE 0.3 MG/0.3ML IJ SOAJ: 0.3000 mg | Freq: Once | INTRAMUSCULAR | 5 refills | Status: AC

## 2015-12-24 NOTE — Progress Notes (Signed)
Pre visit review using our clinic review tool, if applicable. No additional management support is needed unless otherwise documented below in the visit note. 

## 2015-12-24 NOTE — Assessment & Plan Note (Addendum)
BP better today and recently at home-think that's related to lifestyle changes Continue low-sodium diet, weight loss efforts Start regular exercise Decreased alcohol intake Continue to monitor blood pressure at home Discussed that I will may be increasing her blood pressure I will call if her blood pressure is elevated at home we can consider labetalol Blood work today

## 2015-12-24 NOTE — Assessment & Plan Note (Signed)
Slight murmur on exam Given her surgery as a child will go ahead and order an echocardiogram to further evaluation

## 2015-12-24 NOTE — Patient Instructions (Signed)
  Test(s) ordered today. Your results will be released to [facility] (or called to you) after review, usually within 72hours after test completion. If any changes need to be made, you will be notified at that same time.   Medications reviewed and updated.  No changes recommended at this time.   An Echo was ordered - someone will call you to schedule it.   Please followup as needed or in one year for a physical.

## 2015-12-26 ENCOUNTER — Encounter: Payer: Self-pay | Admitting: Emergency Medicine

## 2016-02-26 ENCOUNTER — Encounter: Payer: Self-pay | Admitting: Internal Medicine

## 2016-03-03 DIAGNOSIS — O1420 HELLP syndrome (HELLP), unspecified trimester: Secondary | ICD-10-CM

## 2016-03-03 HISTORY — DX: HELLP syndrome (HELLP), unspecified trimester: O14.20

## 2016-03-07 ENCOUNTER — Ambulatory Visit (HOSPITAL_COMMUNITY): Payer: 59 | Attending: Cardiovascular Disease

## 2016-03-07 ENCOUNTER — Other Ambulatory Visit: Payer: Self-pay

## 2016-03-07 DIAGNOSIS — I5189 Other ill-defined heart diseases: Secondary | ICD-10-CM | POA: Diagnosis not present

## 2016-03-07 DIAGNOSIS — R011 Cardiac murmur, unspecified: Secondary | ICD-10-CM

## 2016-03-07 DIAGNOSIS — R000 Tachycardia, unspecified: Secondary | ICD-10-CM | POA: Diagnosis not present

## 2016-03-21 ENCOUNTER — Telehealth: Payer: Self-pay | Admitting: Emergency Medicine

## 2016-03-21 ENCOUNTER — Encounter: Payer: Self-pay | Admitting: Emergency Medicine

## 2016-03-21 MED ORDER — LABETALOL HCL 100 MG PO TABS: 100.0000 mg | ORAL_TABLET | Freq: Two times a day (BID) | ORAL | 5 refills | Status: DC

## 2016-03-21 NOTE — Telephone Encounter (Signed)
Start labetalol 100 mg twice daily.  Sent to pof.  Monitor BP at home.  Follow up with me in 3 weeks or so.

## 2016-03-21 NOTE — Telephone Encounter (Signed)
Spoke with pt to inform. Follow-up has been scheduled.

## 2016-03-21 NOTE — Telephone Encounter (Signed)
Spoke with pt to give echo results. She states that her BP is still a little on the high side, it does fluctuate, and is normal with some readings. She is not against talking a BP med if you fell that is necessary. She would like something that is okay to take if she gets pregnant. Please advise.

## 2016-04-13 DIAGNOSIS — I5189 Other ill-defined heart diseases: Secondary | ICD-10-CM | POA: Insufficient documentation

## 2016-04-13 DIAGNOSIS — I519 Heart disease, unspecified: Secondary | ICD-10-CM | POA: Insufficient documentation

## 2016-04-13 DIAGNOSIS — I10 Essential (primary) hypertension: Secondary | ICD-10-CM | POA: Insufficient documentation

## 2016-04-13 NOTE — Progress Notes (Signed)
Subjective:    Patient ID: Angelica [name], female    DOB: 1986-02-25, 31 y.o.   MRN: [id number]  HPI The patient is here for follow up.  Hypertension: We started labetalol since she was here last.  She had an echo and it showed grade 2 diastolic dysfunction.  She is taking her medication daily. She felt a little tired initially when she first started it but that has improved.  She is compliant with a low sodium diet.  She denies chest pain, palpitations, edema, shortness of breath and regular headaches. She is exercising irregularly.  She does monitor her blood pressure at home - 140/102, 124/82, 138/86 since starting the medication.      Medications and allergies reviewed with patient and updated if appropriate.  Patient Active Problem List   Diagnosis Date Noted  . Hypertension 04/13/2016  . Echocardiogram shows left ventricular diastolic dysfunction [id number]  . Immune to hepatitis B 02/14/2015  . Anxiety and depression 02/14/2013  . ADD (attention deficit disorder) 02/14/2013  . Allergic rhinitis 02/14/2013    Current Outpatient Prescriptions on File Prior to Visit  Medication Sig Dispense Refill  . amphetamine-dextroamphetamine (ADDERALL) 10 MG tablet Adderall 30 mg in morning and 10-20 mg in afternoon 60 tablet 0  . busPIRone (BUSPAR) 15 MG tablet Take 15 mg by mouth 3 (three) times daily.    . cloNIDine (CATAPRES) 0.2 MG tablet Take 0.2 mg by mouth 2 (two) times daily.    . folic acid (FOLVITE) 1 MG tablet Take 1 tablet (1 mg total) by mouth daily. 90 tablet 3  . levonorgestrel (MIRENA) 20 MCG/24HR IUD 1 each by Intrauterine route once.    Marland Kitchen LORazepam (ATIVAN) 0.5 MG tablet Take 0.5 mg by mouth every 8 (eight) hours.    . sertraline (ZOLOFT) 100 MG tablet Take 2 tablets (200 mg total) by mouth daily. 60 tablet 2   No current facility-administered medications on file prior to visit.     Past Medical History:  Diagnosis Date  . Allergy   . Anxiety   . Depression    . Tracheoesophageal fistula [facility])     Past Surgical History:  Procedure Laterality Date  . DILATION AND CURETTAGE OF UTERUS  01/2013  . ESOPHAGUS SURGERY    . TRACHEAL SURGERY      Social History   Social History  . Marital status: Single    Spouse name: n/a  . Number of children: 0  . Years of education: Master's   Occupational History  . 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 [employer]     private practice   Social History Main Topics  . Smoking status: Former Research scientist (life sciences)  . Smokeless tobacco: Never Used  . Alcohol use 3.0 - 6.0 oz/week    5 - 10 Standard drinks or equivalent per week     Comment: "more than I should, it's a stupid coping mechanism."  . Drug use: No  . Sexual activity: Yes    Partners: Male    Birth control/ protection: Pill   Other Topics Concern  . Not on file   Social History Narrative   Graduated from [employer] 2008.   Lives with her fiance.   Engaged to be married in May 2017.          Family History  Problem Relation Age of Onset  . Diabetes Maternal Grandfather   . Stroke Paternal Grandmother   .  Hyperlipidemia Paternal Grandmother   . Mental illness Mother   . Mental illness Sister   . Cancer Maternal Grandmother     Review of Systems  Constitutional: Negative for fever.  Respiratory: Negative for shortness of breath.   Cardiovascular: Negative for chest pain, palpitations and leg swelling.  Neurological: Negative for dizziness, light-headedness and headaches.       Objective:   Vitals:   04/14/16 1112  BP: (!) 136/96  Pulse: 82  Resp: 16  Temp: 98.2 F (36.8 C)   Wt Readings from Last 3 Encounters:  04/14/16 166 lb (75.3 kg)  12/24/15 169 lb (76.7 kg)  11/27/15 174 lb (78.9 kg)   Body mass index is 27.62 kg/m.   Physical Exam    Constitutional: Appears well-developed and well-nourished. No distress.  HENT:  Head: Normocephalic and atraumatic.  Neck: Neck  supple. No tracheal deviation present. No thyromegaly present.  No cervical lymphadenopathy Cardiovascular: Normal rate, regular rhythm and normal heart sounds.   No murmur heard. No carotid bruit .  No edema Pulmonary/Chest: Effort normal and breath sounds normal. No respiratory distress. No has no wheezes. No rales.  Skin: Skin is warm and dry. Not diaphoretic.  Psychiatric: Normal mood and affect. Behavior is normal.      Assessment & Plan:    See Problem List for Assessment and Plan of chronic medical problems.

## 2016-04-13 NOTE — Assessment & Plan Note (Addendum)
Since her last visit we started labetalol due to variable BP and LVH on echo BP still on high side - increase dose to 200 mg twice daily Increase exercise Work on weight loss Low sodium diet Monitor BP at home - discussed goal of less than 130/80 F/u in 6 months

## 2016-04-14 ENCOUNTER — Encounter: Payer: Self-pay | Admitting: Internal Medicine

## 2016-04-14 ENCOUNTER — Ambulatory Visit (INDEPENDENT_AMBULATORY_CARE_PROVIDER_SITE_OTHER): Payer: 59 | Admitting: Internal Medicine

## 2016-04-14 VITALS — BP 136/96 | HR 82 | Temp 98.2°F | Resp 16 | Wt 166.0 lb

## 2016-04-14 DIAGNOSIS — I5189 Other ill-defined heart diseases: Secondary | ICD-10-CM

## 2016-04-14 DIAGNOSIS — I519 Heart disease, unspecified: Secondary | ICD-10-CM

## 2016-04-14 DIAGNOSIS — I10 Essential (primary) hypertension: Secondary | ICD-10-CM

## 2016-04-14 MED ORDER — LABETALOL HCL 200 MG PO TABS: 200.0000 mg | ORAL_TABLET | Freq: Two times a day (BID) | ORAL | 1 refills | Status: DC

## 2016-04-14 NOTE — Assessment & Plan Note (Signed)
Reviewed results Discussed importance of good BP control -med adjusted

## 2016-04-14 NOTE — Patient Instructions (Addendum)
   No immunizations administered today.   Medications reviewed and updated.  Changes include increasing labetalol to 200 mg twice daily.   Your prescription(s) have been submitted to your pharmacy. Please take as directed and contact our office if you believe you are having problem(s) with the medication(s).   Please followup in 6 months

## 2016-04-14 NOTE — Progress Notes (Signed)
Pre visit review using our clinic review tool, if applicable. No additional management support is needed unless otherwise documented below in the visit note. 

## 2016-06-24 DIAGNOSIS — Z01419 Encounter for gynecological examination (general) (routine) without abnormal findings: Secondary | ICD-10-CM | POA: Diagnosis not present

## 2016-06-24 DIAGNOSIS — Z6821 Body mass index (BMI) 21.0-21.9, adult: Secondary | ICD-10-CM | POA: Diagnosis not present

## 2016-10-09 ENCOUNTER — Telehealth: Payer: Self-pay | Admitting: Internal Medicine

## 2016-10-09 NOTE — Telephone Encounter (Signed)
LVM for pt to call back and discuss.  

## 2016-10-09 NOTE — Telephone Encounter (Signed)
Has she seen ob/gyn yet?  She needs to see them as soon as possible.  Has she stopped some of her other medications?   If they are ok with managing her BP that is fine with me and can just see them during her pregnancy.

## 2016-10-09 NOTE — Telephone Encounter (Signed)
Patient called and stated that she is pregnant. *Congraulations* and that she thought Dr. Quay Burow said if this happen, her OB could take over her BP medication. So that ment she did not need to come into her appointment on 8/14. Please advise if patient can cancel. Thank you.

## 2016-10-13 NOTE — Telephone Encounter (Signed)
Patient is going to continue to follow up with her OB. She has been seeing them about once a month already. They have no issues taking her BP medication. They will do a Echo on the baby at 20 weeks to make sure everything with it, is fine.

## 2016-10-13 NOTE — Telephone Encounter (Signed)
noted 

## 2016-10-14 ENCOUNTER — Ambulatory Visit: Payer: 59 | Admitting: Internal Medicine

## 2016-10-28 ENCOUNTER — Other Ambulatory Visit: Payer: Self-pay | Admitting: Internal Medicine

## 2016-10-28 NOTE — Telephone Encounter (Signed)
Yes gyn should be managing now

## 2016-10-28 NOTE — Telephone Encounter (Signed)
Is GYN taking care of her BP meds?

## 2016-11-03 ENCOUNTER — Other Ambulatory Visit: Payer: Self-pay | Admitting: Internal Medicine

## 2016-11-27 DIAGNOSIS — Q249 Congenital malformation of heart, unspecified: Secondary | ICD-10-CM | POA: Diagnosis not present

## 2016-12-19 DIAGNOSIS — Z23 Encounter for immunization: Secondary | ICD-10-CM | POA: Diagnosis not present

## 2016-12-25 ENCOUNTER — Inpatient Hospital Stay (HOSPITAL_COMMUNITY)
Admission: AD | Admit: 2016-12-25 | Discharge: 2016-12-31 | DRG: 787 | Disposition: A | Discharge status: Home / Self Care | Payer: 59 | Source: Ambulatory Visit | Attending: Obstetrics | Admitting: Obstetrics

## 2016-12-25 ENCOUNTER — Encounter: Payer: Self-pay | Admitting: Student

## 2016-12-25 ENCOUNTER — Inpatient Hospital Stay (HOSPITAL_COMMUNITY): Payer: 59

## 2016-12-25 DIAGNOSIS — F419 Anxiety disorder, unspecified: Secondary | ICD-10-CM | POA: Diagnosis present

## 2016-12-25 DIAGNOSIS — Z3A25 25 weeks gestation of pregnancy: Secondary | ICD-10-CM

## 2016-12-25 DIAGNOSIS — Z7982 Long term (current) use of aspirin: Secondary | ICD-10-CM

## 2016-12-25 DIAGNOSIS — D62 Acute posthemorrhagic anemia: Secondary | ICD-10-CM

## 2016-12-25 DIAGNOSIS — O112 Pre-existing hypertension with pre-eclampsia, second trimester: Secondary | ICD-10-CM | POA: Diagnosis not present

## 2016-12-25 DIAGNOSIS — O1424 HELLP syndrome, complicating childbirth: Principal | ICD-10-CM | POA: Diagnosis present

## 2016-12-25 DIAGNOSIS — Z3A24 24 weeks gestation of pregnancy: Secondary | ICD-10-CM

## 2016-12-25 DIAGNOSIS — O1002 Pre-existing essential hypertension complicating childbirth: Secondary | ICD-10-CM | POA: Diagnosis not present

## 2016-12-25 DIAGNOSIS — D251 Intramural leiomyoma of uterus: Secondary | ICD-10-CM | POA: Diagnosis present

## 2016-12-25 DIAGNOSIS — O9081 Anemia of the puerperium: Secondary | ICD-10-CM | POA: Diagnosis not present

## 2016-12-25 DIAGNOSIS — Z6791 Unspecified blood type, Rh negative: Secondary | ICD-10-CM

## 2016-12-25 DIAGNOSIS — O26893 Other specified pregnancy related conditions, third trimester: Secondary | ICD-10-CM | POA: Diagnosis present

## 2016-12-25 DIAGNOSIS — O1420 HELLP syndrome (HELLP), unspecified trimester: Secondary | ICD-10-CM

## 2016-12-25 DIAGNOSIS — O3413 Maternal care for benign tumor of corpus uteri, third trimester: Secondary | ICD-10-CM | POA: Diagnosis present

## 2016-12-25 DIAGNOSIS — O99344 Other mental disorders complicating childbirth: Secondary | ICD-10-CM | POA: Diagnosis present

## 2016-12-25 DIAGNOSIS — O9912 Other diseases of the blood and blood-forming organs and certain disorders involving the immune mechanism complicating childbirth: Secondary | ICD-10-CM | POA: Diagnosis present

## 2016-12-25 DIAGNOSIS — F329 Major depressive disorder, single episode, unspecified: Secondary | ICD-10-CM | POA: Diagnosis present

## 2016-12-25 DIAGNOSIS — O10912 Unspecified pre-existing hypertension complicating pregnancy, second trimester: Secondary | ICD-10-CM | POA: Diagnosis present

## 2016-12-25 DIAGNOSIS — O321XX0 Maternal care for breech presentation, not applicable or unspecified: Secondary | ICD-10-CM | POA: Diagnosis present

## 2016-12-25 DIAGNOSIS — O1410 Severe pre-eclampsia, unspecified trimester: Secondary | ICD-10-CM | POA: Diagnosis present

## 2016-12-25 DIAGNOSIS — D696 Thrombocytopenia, unspecified: Secondary | ICD-10-CM | POA: Diagnosis present

## 2016-12-25 DIAGNOSIS — Z87891 Personal history of nicotine dependence: Secondary | ICD-10-CM

## 2016-12-25 HISTORY — DX: Essential (primary) hypertension: I10

## 2016-12-25 LAB — TYPE AND SCREEN
ABO/RH(D): O NEG
ANTIBODY SCREEN: NEGATIVE

## 2016-12-25 LAB — CBC
HCT: 39.4 % (ref 36.0–46.0)
HEMATOCRIT: 37.9 % (ref 36.0–46.0)
Hemoglobin: 14 g/dL (ref 12.0–15.0)
Hemoglobin: 13.6 g/dL (ref 12.0–15.0)
MCH: 30 pg (ref 26.0–34.0)
MCH: 30.2 pg (ref 26.0–34.0)
MCHC: 35.5 g/dL (ref 30.0–36.0)
MCHC: 35.9 g/dL (ref 30.0–36.0)
MCV: 84.4 fL (ref 78.0–100.0)
MCV: 84.2 fL (ref 78.0–100.0)
PLATELETS: 123 10*3/uL — AB (ref 150–400)
PLATELETS: 128 10*3/uL — AB (ref 150–400)
RBC: 4.67 MIL/uL (ref 3.87–5.11)
RBC: 4.5 MIL/uL (ref 3.87–5.11)
RDW: 13.3 % (ref 11.5–15.5)
RDW: 13.4 % (ref 11.5–15.5)
WBC: 9 10*3/uL (ref 4.0–10.5)
WBC: 11.4 10*3/uL — AB (ref 4.0–10.5)

## 2016-12-25 LAB — COMPREHENSIVE METABOLIC PANEL
ALT: 61 U/L — AB (ref 14–54)
ALT: 54 U/L (ref 14–54)
AST: 58 U/L — ABNORMAL HIGH (ref 15–41)
AST: 46 U/L — ABNORMAL HIGH (ref 15–41)
Albumin: 3.1 g/dL — ABNORMAL LOW (ref 3.5–5.0)
Albumin: 2.9 g/dL — ABNORMAL LOW (ref 3.5–5.0)
Alkaline Phosphatase: 92 U/L (ref 38–126)
Alkaline Phosphatase: 84 U/L (ref 38–126)
Anion gap: 8 (ref 5–15)
Anion gap: 9 (ref 5–15)
BUN: 24 mg/dL — ABNORMAL HIGH (ref 6–20)
BUN: 22 mg/dL — ABNORMAL HIGH (ref 6–20)
CHLORIDE: 105 mmol/L (ref 101–111)
CHLORIDE: 106 mmol/L (ref 101–111)
CO2: 21 mmol/L — ABNORMAL LOW (ref 22–32)
CO2: 18 mmol/L — ABNORMAL LOW (ref 22–32)
CREATININE: 1.04 mg/dL — AB (ref 0.44–1.00)
Calcium: 9.3 mg/dL (ref 8.9–10.3)
Calcium: 9 mg/dL (ref 8.9–10.3)
Creatinine, Ser: 0.94 mg/dL (ref 0.44–1.00)
Glucose, Bld: 77 mg/dL (ref 65–99)
Glucose, Bld: 94 mg/dL (ref 65–99)
POTASSIUM: 4.5 mmol/L (ref 3.5–5.1)
POTASSIUM: 4.3 mmol/L (ref 3.5–5.1)
Sodium: 134 mmol/L — ABNORMAL LOW (ref 135–145)
Sodium: 133 mmol/L — ABNORMAL LOW (ref 135–145)
Total Bilirubin: 0.2 mg/dL — ABNORMAL LOW (ref 0.3–1.2)
Total Bilirubin: 0.4 mg/dL (ref 0.3–1.2)
Total Protein: 6.3 g/dL — ABNORMAL LOW (ref 6.5–8.1)
Total Protein: 5.8 g/dL — ABNORMAL LOW (ref 6.5–8.1)

## 2016-12-25 LAB — URINALYSIS, ROUTINE W REFLEX MICROSCOPIC
BILIRUBIN URINE: NEGATIVE
GLUCOSE, UA: NEGATIVE mg/dL
Hgb urine dipstick: NEGATIVE
Ketones, ur: NEGATIVE mg/dL
LEUKOCYTES UA: NEGATIVE
NITRITE: NEGATIVE
PH: 6 (ref 5.0–8.0)
Protein, ur: >=300 mg/dL — AB
SPECIFIC GRAVITY, URINE: 1.009 (ref 1.005–1.030)

## 2016-12-25 LAB — ABO/RH: ABO/RH(D): O NEG

## 2016-12-25 LAB — PROTEIN / CREATININE RATIO, URINE
Creatinine, Urine: 53 mg/dL
PROTEIN CREATININE RATIO: 9.36 mg/mg{creat} — AB (ref 0.00–0.15)
TOTAL PROTEIN, URINE: 496 mg/dL

## 2016-12-25 LAB — URIC ACID: Uric Acid, Serum: 7.6 mg/dL — ABNORMAL HIGH (ref 2.3–6.6)

## 2016-12-25 LAB — TSH: TSH: 0.894 u[IU]/mL (ref 0.350–4.500)

## 2016-12-25 MED ORDER — BETAMETHASONE SOD PHOS & ACET 6 (3-3) MG/ML IJ SUSP
12.0000 mg | INTRAMUSCULAR | Status: AC
  Administered 2016-12-25 – 2016-12-26 (×2): 12 mg via INTRAMUSCULAR
  Filled 2016-12-25 (×2): qty 2

## 2016-12-25 MED ORDER — LABETALOL HCL 5 MG/ML IV SOLN
20.0000 mg | INTRAVENOUS | Status: AC | PRN
  Administered 2016-12-25: 40 mg via INTRAVENOUS
  Administered 2016-12-25: 80 mg via INTRAVENOUS
  Administered 2016-12-25: 20 mg via INTRAVENOUS
  Filled 2016-12-25: qty 16
  Filled 2016-12-25: qty 8
  Filled 2016-12-25: qty 4

## 2016-12-25 MED ORDER — MAGNESIUM SULFATE 40 G IN LACTATED RINGERS - SIMPLE
2.0000 g/h | INTRAVENOUS | Status: AC
Start: 2016-12-25 — End: 2016-12-26
  Filled 2016-12-25 (×2): qty 500

## 2016-12-25 MED ORDER — ACETAMINOPHEN 325 MG PO TABS
650.0000 mg | ORAL_TABLET | ORAL | Status: DC | PRN
  Administered 2016-12-26 (×2): 650 mg via ORAL
  Filled 2016-12-25 (×2): qty 2

## 2016-12-25 MED ORDER — SODIUM CHLORIDE 0.9 % IV SOLN: 250.0000 mL | INTRAVENOUS | Status: DC | PRN

## 2016-12-25 MED ORDER — SODIUM CHLORIDE 0.9% FLUSH
3.0000 mL | Freq: Two times a day (BID) | INTRAVENOUS | Status: DC
  Administered 2016-12-25 – 2016-12-27 (×2): 3 mL via INTRAVENOUS

## 2016-12-25 MED ORDER — LACTATED RINGERS IV SOLN
INTRAVENOUS | Status: DC
  Administered 2016-12-25 – 2016-12-27 (×2): via INTRAVENOUS

## 2016-12-25 MED ORDER — ZOLPIDEM TARTRATE 5 MG PO TABS
5.0000 mg | ORAL_TABLET | Freq: Every evening | ORAL | Status: DC | PRN
Start: 2016-12-25 — End: 2016-12-28

## 2016-12-25 MED ORDER — SODIUM CHLORIDE 0.9% FLUSH
3.0000 mL | INTRAVENOUS | Status: DC | PRN
Start: 2016-12-25 — End: 2016-12-28

## 2016-12-25 MED ORDER — MAGNESIUM SULFATE BOLUS VIA INFUSION
4.0000 g | Freq: Once | INTRAVENOUS | Status: AC
  Administered 2016-12-25: 4 g via INTRAVENOUS
  Filled 2016-12-25: qty 500

## 2016-12-25 MED ORDER — MAGNESIUM SULFATE 4 GM/100ML IV SOLN: 4.0000 g | Freq: Once | INTRAVENOUS | Status: DC

## 2016-12-25 MED ORDER — LORAZEPAM 1 MG PO TABS: 0.5000 mg | ORAL_TABLET | Freq: Once | ORAL | Status: AC

## 2016-12-25 MED ORDER — CALCIUM CARBONATE ANTACID 500 MG PO CHEW
2.0000 | CHEWABLE_TABLET | ORAL | Status: DC | PRN
Start: 2016-12-25 — End: 2016-12-28
  Administered 2016-12-25 – 2016-12-26 (×2): 400 mg via ORAL
  Filled 2016-12-25 (×2): qty 2

## 2016-12-25 MED ORDER — LORAZEPAM 2 MG/ML IJ SOLN
0.5000 mg | Freq: Once | INTRAMUSCULAR | Status: AC
  Administered 2016-12-25: 0.5 mg via INTRAVENOUS
  Filled 2016-12-25: qty 0.25

## 2016-12-25 MED ORDER — MAGNESIUM SULFATE 40 G IN LACTATED RINGERS - SIMPLE: 2.0000 g/h | Freq: Once | INTRAVENOUS | Status: DC

## 2016-12-25 MED ORDER — LABETALOL HCL 100 MG PO TABS
200.0000 mg | ORAL_TABLET | Freq: Once | ORAL | Status: AC
  Administered 2016-12-25: 200 mg via ORAL
  Filled 2016-12-25: qty 2

## 2016-12-25 MED ORDER — PRENATAL MULTIVITAMIN CH
1.0000 | ORAL_TABLET | Freq: Every day | ORAL | Status: DC
  Administered 2016-12-26 – 2016-12-27 (×2): 1 via ORAL
  Filled 2016-12-25 (×2): qty 1

## 2016-12-25 MED ORDER — NIFEDIPINE 10 MG PO CAPS: 10.0000 mg | ORAL_CAPSULE | Freq: Once | ORAL | Status: DC

## 2016-12-25 MED ORDER — DOCUSATE SODIUM 100 MG PO CAPS
100.0000 mg | ORAL_CAPSULE | Freq: Every day | ORAL | Status: DC
Start: 2016-12-26 — End: 2016-12-31
  Administered 2016-12-27 – 2016-12-31 (×5): 100 mg via ORAL
  Filled 2016-12-25 (×6): qty 1

## 2016-12-25 MED ORDER — HYDRALAZINE HCL 20 MG/ML IJ SOLN
10.0000 mg | Freq: Once | INTRAMUSCULAR | Status: AC | PRN
  Administered 2016-12-25: 10 mg via INTRAVENOUS
  Filled 2016-12-25: qty 1

## 2016-12-25 MED ORDER — LABETALOL HCL 200 MG PO TABS
400.0000 mg | ORAL_TABLET | Freq: Three times a day (TID) | ORAL | Status: DC
  Administered 2016-12-26 – 2016-12-28 (×7): 400 mg via ORAL
  Filled 2016-12-25 (×7): qty 2

## 2016-12-25 MED ORDER — SODIUM CHLORIDE 0.9 % IV SOLN
8.0000 mg | Freq: Once | INTRAVENOUS | Status: AC
  Administered 2016-12-25: 8 mg via INTRAVENOUS
  Filled 2016-12-25: qty 4

## 2016-12-25 MED ORDER — ACETAMINOPHEN 325 MG PO TABS
650.0000 mg | ORAL_TABLET | Freq: Once | ORAL | Status: AC
  Administered 2016-12-25: 650 mg via ORAL
  Filled 2016-12-25: qty 2

## 2016-12-25 NOTE — H&P (Signed)
CC: 31 yo G2P0010 at 24'5 for evaluation of HA and htn. Pt with h/o chronic htn, had been well controlled on labetalol 100mg  po bid. Pt seen last week for routine visit, had skipped her labetalol an dbp 150/100 was noted, she had negative PEC labs (Cr 0.9, AST/ ALT 16/16, Plt 173, U dip 100mg  protein), normal exam other than anxiety and was instructed to increase her labetalol to 200mg  po tid. Pt only increased to 100mg  tid. On 10/23 she noted bp 140/100 and started having a HA. On 10/24 pm she noted bp 150/100 with mild HA, called and was instructed to increase her labetalol to 200 tid. On f/u today, bps have remained 150s/100s and pt still with mild HA. She has not tried tylenol. She is on baby ASA due to the h/o htn. No vision change, no RUQ pain, no significant nausea or emesis, no scotomata. Good FM, no LOF, no ctx, no VB.  P[facility] at [facility] since 7 wks - Dated by early u/s, c/w LMP - Chronic htn. Well controlled until now, on labetalol - low lying placenta at 18 wks, otherwise nl anatomy, nl NT, nl AFP - FOB sister with developmental delay and gain of chromosome 5p, FOB has not been tested but with normal development - Rh neg - rubella NI - h/o depression and anxiety. 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 Normal fetal echo this pregnancy.   Prenatal Transfer Tool  Maternal Diabetes: No Genetic Screening: Normal Maternal Ultrasounds/Referrals: Normal Fetal Ultrasounds or other Referrals:  Fetal echo Maternal Substance Abuse:  No Significant Maternal Medications:  None Significant Maternal Lab Results: None     OB History    Gravida Para Term Preterm AB Living   2       1     SAB TAB Ectopic Multiple Live Births   1             Past Medical History:  Diagnosis Date  . Allergy   . Anxiety   . Chronic hypertension   . Depression    . Tracheoesophageal fistula [facility])    Past Surgical History:  Procedure Laterality Date  . DILATION AND CURETTAGE OF UTERUS  01/2013  . ESOPHAGUS SURGERY    . TRACHEAL SURGERY     Family History: family history includes Cancer in her maternal grandmother; Diabetes in her maternal grandfather; Hyperlipidemia in her paternal grandmother; Mental illness in her mother and sister; Stroke in her paternal grandmother. Social History:  reports that she has quit smoking. She has never used smokeless tobacco. She reports that she does not drink alcohol or use drugs.  Review of Systems - Negative except anxiety, mild HA     Blood pressure (!) 168/108, pulse 72, temperature 97.9 F (36.6 C), resp. rate 18, height 5\' 5"  (1.651 m), weight 81.6 kg (180 lb).  Physical Exam:  Gen: no distress, upset, cryin CV: RRR Pulm: CTAB Back: no CVAT Abd: gravid, NT, no RUQ pain LE: no edema, equal bilaterally, non-tender. 3_ DTR, no clonus Toco: none FH: baseline 140s, BTB variability, appropriate for 24 wks.   Prenatal labs: ABO, Rh: --/--/O NEG, O NEG (10/25 1818) Antibody: NEG (10/25 1818) Rubella:  non-immune RPR:   NR HBsAg:   neg HIV:   neg GBS:   not done 1 hr Glucola not yet done  Genetic screening nl  NT, nl AFP Anatomy US normal, nl fetal echo   CBC    Component Value Date/Time   WBC 9.0 12/25/2016 1711   RBC 4.67 12/25/2016 1711   HGB 14.0 12/25/2016 1711   HCT 39.4 12/25/2016 1711   PLT 123 (L) 12/25/2016 1711   MCV 84.4 12/25/2016 1711   MCV 87.4 05/15/2011 1155   MCH 30.0 12/25/2016 1711   MCHC 35.5 12/25/2016 1711   RDW 13.3 12/25/2016 1711   LYMPHSABS 1.7 12/24/2015 1050   MONOABS 0.3 12/24/2015 1050   EOSABS 0.2 12/24/2015 1050   BASOSABS 0.0 12/24/2015 1050    CMP     Component Value Date/Time   NA 134 (L) 12/25/2016 1711   NA 141 01/08/2015   K 4.5 12/25/2016 1711   CL 105 12/25/2016 1711   CO2 21 (L) 12/25/2016 1711   GLUCOSE 77 12/25/2016 1711   BUN 24 (H)  12/25/2016 1711   BUN 12 01/08/2015   CREATININE 1.04 (H) 12/25/2016 1711   CALCIUM 9.3 12/25/2016 1711   PROT 6.3 (L) 12/25/2016 1711   ALBUMIN 3.1 (L) 12/25/2016 1711   AST 58 (H) 12/25/2016 1711   ALT 61 (H) 12/25/2016 1711   ALKPHOS 92 12/25/2016 1711   BILITOT 0.2 (L) 12/25/2016 1711   GFRNONAA >60 12/25/2016 1711   GFRAA >60 12/25/2016 1711      Assessment/Plan: 31 y.o. G2P0010 at [redacted]w[redacted]d with h/o chronic htn, now with worsening bps and lab evidence of HELLP. BMZ now, start Mag and plan at least 24 hrs, repeat labs in 6 hrs, continuous fetal monitoring, 24 urine for protein/ creatinine, bes rest with bathroom privileges. Repeat u/s in am with MFM consult, will plan u/s sooner if concerns for placental insufficiency. Increase home labetalol, add procardia. Currently still workng with IV meds to bring bp down acutely. Strong anxiety component to bps, give Ativan now, continue sertraline and Buspar. Pt aware likelihood or PTD and PCS.    Ngoc Daughtridge A. 12/25/2016, 7:35 PM

## 2016-12-25 NOTE — MAU Provider Note (Signed)
History     CSN: [id number]  Arrival date and time: 12/25/16 1541   First Provider Initiated Contact with Patient 12/25/16 1612      Chief Complaint  Patient presents with  . Hypertension   HPI Angelica [name] is a 31 y.o. G2P0010 at [redacted]w[redacted]d who presents for hypertension & headache. PMH significant for chronic hypertension. Was taking labetalol 100 BID, was increased to 200 mg TID but states she was "nervous to increase it that much" so gradually increased it since last Friday. Started labetalol TID last night. Last dose was this morning at 930 am.  Reports throbbing frontal headache x 2 days. Rates pain 4/10. Has not treated. Nothing makes pain better or worse. Denies visual disturbance or epigastric pain. Positive fetal movement.  OB History    Gravida Para Term Preterm AB Living   2       1     SAB TAB Ectopic Multiple Live Births   1              Past Medical History:  Diagnosis Date  . Allergy   . Anxiety   . Chronic hypertension   . Depression   . Tracheoesophageal fistula [facility])     Past Surgical History:  Procedure Laterality Date  . DILATION AND CURETTAGE OF UTERUS  01/2013  . ESOPHAGUS SURGERY    . TRACHEAL SURGERY      Family History  Problem Relation Age of Onset  . Diabetes Maternal Grandfather   . Stroke Paternal Grandmother   . Hyperlipidemia Paternal Grandmother   . Mental illness Mother   . Mental illness Sister   . Cancer Maternal Grandmother     Social History  Substance Use Topics  . Smoking status: Former Research scientist (life sciences)  . Smokeless tobacco: Never Used  . Alcohol use No     Comment: not since preg    Allergies:  Allergies  Allergen Reactions  . Latex Anaphylaxis and Hives  . Avocado     Sensitivity, not sure  . Banana Other (See Comments)    Sensitivity, not sure  . Kiwi Extract     Sensitivity, not sure    Prescriptions Prior to Admission  Medication Sig Dispense Refill Last Dose  . busPIRone (BUSPAR) 15 MG tablet Take 15 mg by  mouth 3 (three) times daily.   Taking  . DOCOSAHEXAENOIC ACID PO Take by mouth.     . EPINEPHrine 0.3 mg/0.3 mL IJ SOAJ injection INJECT 0.3MLS INTO THE MUSCLE ONCE  5 Taking  . folic acid (FOLVITE) 1 MG tablet Take 1 tablet (1 mg total) by mouth daily. 90 tablet 3 Taking  . labetalol (NORMODYNE) 200 MG tablet Take 1 tablet (200 mg total) by mouth 2 (two) times daily. 180 tablet 1   . LORazepam (ATIVAN) 0.5 MG tablet Take 0.5 mg by mouth every 8 (eight) hours.   Taking  . sertraline (ZOLOFT) 100 MG tablet Take 2 tablets (200 mg total) by mouth daily. 60 tablet 2 Taking  . [DISCONTINUED] amphetamine-dextroamphetamine (ADDERALL) 10 MG tablet Adderall 30 mg in morning and 10-20 mg in afternoon 60 tablet 0 Taking  . [DISCONTINUED] amphetamine-dextroamphetamine (ADDERALL) 20 MG tablet TAKE 1 TABLET TWICE A DAY, EVERY MORNING AND 12 PM  0 Taking  . [DISCONTINUED] cloNIDine (CATAPRES) 0.2 MG tablet Take 0.2 mg by mouth 2 (two) times daily.   Taking  . [DISCONTINUED] levonorgestrel (MIRENA) 20 MCG/24HR IUD 1 each by Intrauterine route once.   Taking    Review  of Systems  Constitutional: Negative.   Eyes: Negative for photophobia and visual disturbance.  Respiratory: Negative.   Cardiovascular: Negative.   Gastrointestinal: Negative.   Neurological: Positive for headaches.   Physical Exam   Blood pressure (!) 154/106, pulse 61, temperature 97.9 F (36.6 C), resp. rate 18, height 5\' 5"  (1.651 m), weight 180 lb (81.6 kg). Patient Vitals for the past 24 hrs:  BP Temp Pulse Resp Height Weight  12/25/16 1728 (!) 154/106 - 61 - - -  12/25/16 1709 (!) 166/104 - (!) 55 - - -  12/25/16 1653 (!) 162/111 - (!) 57 - - -  12/25/16 1630 (!) 163/108 - (!) 58 - - -  12/25/16 1615 (!) 163/110 - (!) 58 - - -  12/25/16 1604 (!) 168/108 - 60 - - -  12/25/16 1603 (!) 168/108 97.9 F (36.6 C) 60 18 5\' 5"  (1.651 m) 180 lb (81.6 kg)    Physical Exam  Nursing note and vitals reviewed. Constitutional: She is  oriented to person, place, and time. She appears well-developed and well-nourished. No distress.  HENT:  Head: Normocephalic and atraumatic.  Eyes: Conjunctivae are normal. Right eye exhibits no discharge. Left eye exhibits no discharge. No scleral icterus.  Neck: Normal range of motion.  Cardiovascular: Normal rate, regular rhythm and normal heart sounds.   No murmur heard. Respiratory: Effort normal and breath sounds normal. No respiratory distress. She has no wheezes.  GI: Soft. There is no tenderness.  Musculoskeletal: She exhibits edema (BLE, mild, non pitting).  Neurological: She is alert and oriented to person, place, and time.  Reflex Scores:      Patellar reflexes are 3+ on the right side and 3+ on the left side. 2 beats clonus LLE  Skin: Skin is warm and dry. She is not diaphoretic.  Psychiatric: She has a normal mood and affect. Her behavior is normal. Judgment and thought content normal.   Fetal Tracing:  Baseline: 145 Variability: min to moderate Accelerations: none Decelerations: none   Toco: none MAU Course  Procedures Results for orders placed or performed during the hospital encounter of 12/25/16 (from the past 24 hour(s))  Protein / creatinine ratio, urine     Status: Abnormal   Collection Time: 12/25/16  3:48 PM  Result Value Ref Range   Creatinine, Urine 53.00 mg/dL   Total Protein, Urine 496 mg/dL   Protein Creatinine Ratio 9.36 (H) 0.00 - 0.15 mg/mg[Cre]  Urinalysis, Routine w reflex microscopic     Status: Abnormal   Collection Time: 12/25/16  3:48 PM  Result Value Ref Range   Color, Urine STRAW (A) YELLOW   APPearance CLEAR CLEAR   Specific Gravity, Urine 1.009 1.005 - 1.030   pH 6.0 5.0 - 8.0   Glucose, UA NEGATIVE NEGATIVE mg/dL   Hgb urine dipstick NEGATIVE NEGATIVE   Bilirubin Urine NEGATIVE NEGATIVE   Ketones, ur NEGATIVE NEGATIVE mg/dL   Protein, ur >=300 (A) NEGATIVE mg/dL   Nitrite NEGATIVE NEGATIVE   Leukocytes, UA NEGATIVE NEGATIVE    RBC / HPF 0-5 0 - 5 RBC/hpf   WBC, UA 0-5 0 - 5 WBC/hpf   Bacteria, UA RARE (A) NONE SEEN   Squamous Epithelial / LPF 0-5 (A) NONE SEEN   Mucus PRESENT   CBC     Status: Abnormal   Collection Time: 12/25/16  5:11 PM  Result Value Ref Range   WBC 9.0 4.0 - 10.5 K/uL   RBC 4.67 3.87 - 5.11 MIL/uL   Hemoglobin  14.0 12.0 - 15.0 g/dL   HCT 39.4 36.0 - 46.0 %   MCV 84.4 78.0 - 100.0 fL   MCH 30.0 26.0 - 34.0 pg   MCHC 35.5 30.0 - 36.0 g/dL   RDW 13.3 11.5 - 15.5 %   Platelets 123 (L) 150 - 400 K/uL  Comprehensive metabolic panel     Status: Abnormal   Collection Time: 12/25/16  5:11 PM  Result Value Ref Range   Sodium 134 (L) 135 - 145 mmol/L   Potassium 4.5 3.5 - 5.1 mmol/L   Chloride 105 101 - 111 mmol/L   CO2 21 (L) 22 - 32 mmol/L   Glucose, Bld 77 65 - 99 mg/dL   BUN 24 (H) 6 - 20 mg/dL   Creatinine, Ser 1.04 (H) 0.44 - 1.00 mg/dL   Calcium 9.3 8.9 - 10.3 mg/dL   Total Protein 6.3 (L) 6.5 - 8.1 g/dL   Albumin 3.1 (L) 3.5 - 5.0 g/dL   AST 58 (H) 15 - 41 U/L   ALT 61 (H) 14 - 54 U/L   Alkaline Phosphatase 92 38 - 126 U/L   Total Bilirubin 0.2 (L) 0.3 - 1.2 mg/dL   GFR calc non Af Amer >60 >60 mL/min   GFR calc Af Amer >60 >60 mL/min   Anion gap 8 5 - 15    MDM Fetal tracing appropriate for gestation CBC, CMP, urine PCR pending Dr. Pamala Hurry notified of patient's arrival to MAU, BPs, & assessment. Will call back with lab results.  Tylenol 650 mg PO for headache & midday dose of PO labetalol ordered IV labetalol protocol ordered for severe range BPs. Difficult to start IV. IV started & patient given 20 mg IV labetalol, resulted in mild decrease in BP.  C/w Dr. Pamala Hurry regarding BPs & lab results. Will come to speak with patient regarding plans for admission.  Assessment and Plan  A; Chronic hypertension with superimposed preeclampsia  P: Dr. Pamala Hurry en route to see patient for admission  Jorje Guild 12/25/2016, 4:12 PM

## 2016-12-25 NOTE — MAU Note (Signed)
Sent from office for elevated B/P. Has chronic HTN. Pt c/o mild headache.No visual changes reported

## 2016-12-25 NOTE — Progress Notes (Signed)
Late entry:  Seen at 9pm  Pt moved to 3rd floor. Continues with nausea and emesis. Pt notes shaking and anxiety.   S/p labetalol 400 mg po at 4:30 pm. IV labetalol 20 mg then 40mg , then 80mg  then 10mg  IV hydralizine prior to moving to 3rd floor. Shortly after arrival bps to 160/96. Pt given 0.5mg  IV Ativan and 8mg  IV Zofran.   Pt w/o HA, feeling slightly calmer.   PE:  Vitals:   12/25/16 2105 12/25/16 2115 12/25/16 2125 12/25/16 2200  BP: (!) 136/91 (!) 145/88 (!) 138/92 129/81  Pulse: 87 (!) 107 85 75  Resp: 20 20 20 16   Temp: 98.3 F (36.8 C)     TempSrc: Axillary     SpO2: 97% 98% 97% 96%  Weight:      Height:        Gen: crying in bed, anxious but improving  A/P: Plan again reviewed with pt. Control of bp, fetal monitoring, watch labs. Pt aware chance of delivery but hope is to allow time for BMZ.   Recent improvement in bp is likely due to improvement in anxiety with IV Ativan and pt allowed to rest. Also started Magnesium.  30 min spent with pt, critical care time given severity of maternal status at early gestational age. Coordination of care and discussion with MFM as well.   Shaquille Janes A. 12/25/2016 10:51 PM

## 2016-12-26 ENCOUNTER — Encounter (HOSPITAL_COMMUNITY): Payer: 59

## 2016-12-26 DIAGNOSIS — O1410 Severe pre-eclampsia, unspecified trimester: Secondary | ICD-10-CM | POA: Diagnosis present

## 2016-12-26 LAB — COMPREHENSIVE METABOLIC PANEL
ALBUMIN: 2.7 g/dL — AB (ref 3.5–5.0)
ALK PHOS: 84 U/L (ref 38–126)
ALK PHOS: 79 U/L (ref 38–126)
ALT: 49 U/L (ref 14–54)
ALT: 46 U/L (ref 14–54)
ALT: 49 U/L (ref 14–54)
ANION GAP: 9 (ref 5–15)
AST: 41 U/L (ref 15–41)
AST: 38 U/L (ref 15–41)
AST: 44 U/L — AB (ref 15–41)
Albumin: 2.9 g/dL — ABNORMAL LOW (ref 3.5–5.0)
Albumin: 2.7 g/dL — ABNORMAL LOW (ref 3.5–5.0)
Alkaline Phosphatase: 80 U/L (ref 38–126)
Anion gap: 9 (ref 5–15)
Anion gap: 9 (ref 5–15)
BILIRUBIN TOTAL: 0.4 mg/dL (ref 0.3–1.2)
BILIRUBIN TOTAL: 0.1 mg/dL — AB (ref 0.3–1.2)
BUN: 21 mg/dL — AB (ref 6–20)
BUN: 19 mg/dL (ref 6–20)
BUN: 20 mg/dL (ref 6–20)
CALCIUM: 7.5 mg/dL — AB (ref 8.9–10.3)
CALCIUM: 7.8 mg/dL — AB (ref 8.9–10.3)
CHLORIDE: 106 mmol/L (ref 101–111)
CO2: 17 mmol/L — AB (ref 22–32)
CO2: 18 mmol/L — ABNORMAL LOW (ref 22–32)
CO2: 19 mmol/L — ABNORMAL LOW (ref 22–32)
CREATININE: 1.03 mg/dL — AB (ref 0.44–1.00)
Calcium: 8.4 mg/dL — ABNORMAL LOW (ref 8.9–10.3)
Chloride: 100 mmol/L — ABNORMAL LOW (ref 101–111)
Chloride: 104 mmol/L (ref 101–111)
Creatinine, Ser: 1.07 mg/dL — ABNORMAL HIGH (ref 0.44–1.00)
Creatinine, Ser: 1.18 mg/dL — ABNORMAL HIGH (ref 0.44–1.00)
GFR calc Af Amer: >60 mL/min (ref >60)
GFR calc non Af Amer: >60 mL/min (ref >60)
GFR calc non Af Amer: >60 mL/min (ref >60)
GFR calc non Af Amer: >60 mL/min (ref >60)
GLUCOSE: 123 mg/dL — AB (ref 65–99)
GLUCOSE: 136 mg/dL — AB (ref 65–99)
Glucose, Bld: 142 mg/dL — ABNORMAL HIGH (ref 65–99)
POTASSIUM: 3.8 mmol/L (ref 3.5–5.1)
POTASSIUM: 4 mmol/L (ref 3.5–5.1)
Potassium: 4.6 mmol/L (ref 3.5–5.1)
Sodium: 132 mmol/L — ABNORMAL LOW (ref 135–145)
Sodium: 127 mmol/L — ABNORMAL LOW (ref 135–145)
Sodium: 132 mmol/L — ABNORMAL LOW (ref 135–145)
TOTAL PROTEIN: 5.2 g/dL — AB (ref 6.5–8.1)
TOTAL PROTEIN: 5.4 g/dL — AB (ref 6.5–8.1)
Total Bilirubin: 0.4 mg/dL (ref 0.3–1.2)
Total Protein: 5.9 g/dL — ABNORMAL LOW (ref 6.5–8.1)

## 2016-12-26 LAB — CBC WITH DIFFERENTIAL/PLATELET
BASOS ABS: 0 10*3/uL (ref 0.0–0.1)
BASOS PCT: 0 %
BASOS PCT: 0 %
Basophils Absolute: 0 10*3/uL (ref 0.0–0.1)
Basophils Absolute: 0 10*3/uL (ref 0.0–0.1)
Basophils Relative: 0 %
EOS ABS: 0 10*3/uL (ref 0.0–0.7)
EOS ABS: 0 10*3/uL (ref 0.0–0.7)
Eosinophils Absolute: 0 10*3/uL (ref 0.0–0.7)
Eosinophils Relative: 0 %
Eosinophils Relative: 0 %
Eosinophils Relative: 0 %
HEMATOCRIT: 37.2 % (ref 36.0–46.0)
HEMATOCRIT: 36.4 % (ref 36.0–46.0)
HEMATOCRIT: 36.1 % (ref 36.0–46.0)
HEMOGLOBIN: 13.2 g/dL (ref 12.0–15.0)
HEMOGLOBIN: 12.8 g/dL (ref 12.0–15.0)
HEMOGLOBIN: 12.6 g/dL (ref 12.0–15.0)
LYMPHS ABS: 0.9 10*3/uL (ref 0.7–4.0)
LYMPHS ABS: 1 10*3/uL (ref 0.7–4.0)
LYMPHS PCT: 11 %
Lymphocytes Relative: 11 %
Lymphocytes Relative: 12 %
Lymphs Abs: 0.9 10*3/uL (ref 0.7–4.0)
MCH: 29.9 pg (ref 26.0–34.0)
MCH: 30 pg (ref 26.0–34.0)
MCH: 30 pg (ref 26.0–34.0)
MCHC: 35.5 g/dL (ref 30.0–36.0)
MCHC: 35.2 g/dL (ref 30.0–36.0)
MCHC: 34.9 g/dL (ref 30.0–36.0)
MCV: 84.4 fL (ref 78.0–100.0)
MCV: 85.4 fL (ref 78.0–100.0)
MCV: 86 fL (ref 78.0–100.0)
MONO ABS: 0.2 10*3/uL (ref 0.1–1.0)
MONOS PCT: 1 %
MONOS PCT: 3 %
Monocytes Absolute: 0 10*3/uL — ABNORMAL LOW (ref 0.1–1.0)
Monocytes Absolute: 0.2 10*3/uL (ref 0.1–1.0)
Monocytes Relative: 2 %
NEUTROS ABS: 7.8 10*3/uL — AB (ref 1.7–7.7)
NEUTROS PCT: 88 %
NEUTROS PCT: 85 %
NEUTROS PCT: 87 %
Neutro Abs: 7.6 10*3/uL (ref 1.7–7.7)
Neutro Abs: 7.6 10*3/uL (ref 1.7–7.7)
Platelets: 136 10*3/uL — ABNORMAL LOW (ref 150–400)
Platelets: 134 10*3/uL — ABNORMAL LOW (ref 150–400)
Platelets: 141 10*3/uL — ABNORMAL LOW (ref 150–400)
RBC: 4.41 MIL/uL (ref 3.87–5.11)
RBC: 4.26 MIL/uL (ref 3.87–5.11)
RBC: 4.2 MIL/uL (ref 3.87–5.11)
RDW: 13.4 % (ref 11.5–15.5)
RDW: 13.5 % (ref 11.5–15.5)
RDW: 13.5 % (ref 11.5–15.5)
WBC: 8.6 10*3/uL (ref 4.0–10.5)
WBC: 8.8 10*3/uL (ref 4.0–10.5)
WBC: 8.9 10*3/uL (ref 4.0–10.5)

## 2016-12-26 MED ORDER — BUSPIRONE HCL 15 MG PO TABS
15.0000 mg | ORAL_TABLET | Freq: Three times a day (TID) | ORAL | Status: DC
  Administered 2016-12-26 – 2016-12-27 (×4): 15 mg via ORAL
  Filled 2016-12-26 (×6): qty 1

## 2016-12-26 MED ORDER — SODIUM CHLORIDE 0.9 % IV SOLN
8.0000 mg | Freq: Three times a day (TID) | INTRAVENOUS | Status: DC | PRN
  Administered 2016-12-26: 8 mg via INTRAVENOUS
  Filled 2016-12-26 (×2): qty 4

## 2016-12-26 MED ORDER — SERTRALINE HCL 25 MG PO TABS
25.0000 mg | ORAL_TABLET | Freq: Every day | ORAL | Status: DC
  Administered 2016-12-26 – 2016-12-27 (×2): 25 mg via ORAL
  Filled 2016-12-26 (×3): qty 1

## 2016-12-26 MED ORDER — LORAZEPAM 2 MG/ML IJ SOLN
0.5000 mg | Freq: Once | INTRAMUSCULAR | Status: AC
  Administered 2016-12-26: 0.5 mg via INTRAVENOUS
  Filled 2016-12-26: qty 0.25

## 2016-12-26 MED ORDER — PANTOPRAZOLE SODIUM 40 MG IV SOLR
40.0000 mg | Freq: Once | INTRAVENOUS | Status: AC
  Administered 2016-12-26: 40 mg via INTRAVENOUS
  Filled 2016-12-26: qty 40

## 2016-12-26 MED ORDER — PANTOPRAZOLE SODIUM 40 MG IV SOLR
40.0000 mg | Freq: Every day | INTRAVENOUS | Status: DC
  Administered 2016-12-26 – 2016-12-27 (×2): 40 mg via INTRAVENOUS
  Filled 2016-12-26 (×3): qty 40

## 2016-12-26 MED ORDER — METOCLOPRAMIDE HCL 10 MG PO TABS
10.0000 mg | ORAL_TABLET | Freq: Once | ORAL | Status: AC
  Administered 2016-12-26: 10 mg via ORAL
  Filled 2016-12-26: qty 1

## 2016-12-26 NOTE — Consult Note (Signed)
Asked by Dr.Fogleman to provide prenatal consultation for patient at risk for preterm delivery due to preeclampsia.  Mother is 31 y.o. G2 P0-0-1-0 who is now 24 wks 5 days  weeks EGA, with pregnancy complicated by [facility] with superimposed preeclampsia or possible HELLP.  She is being treated with betamethasone (1st dose about 7 pm last night) and antihypertensives.  Discussed with patient, her husband, and father-in-law (with her consent) usual expectations for preterm infant at 76 - [redacted] weeks gestation, including possible needs for DR resuscitation, respiratory support, IV access, and blood products.  Presented generally optimisitic outlook for survival, especially if delivery delayed until 24 hours post BMZ #2; but also noted possibility of  death or serious morbidity.  Projected possible length of stay in NICU until 37 - [redacted] wks EGA.  Discussed advantages of feeding with mother's milk and possible use of donor milk.  She plans to pump postnatally.  Patient and FOB were attentive, had appropriate questions, expressed appreciation of my input.  Thank you for consulting Neonatology.  Total time 25 minutes  JWimmer, MD

## 2016-12-26 NOTE — Consult Note (Signed)
Maternal Fetal Medicine Consultation  Requesting Provider(s): Fogelman  Primary OB: Fogelman Reason for consultation: Severe preeclampsia  HPI: 31yo P0010 now at 24+6 weeks with severe preeclampsia. She has a 1 year history of CHTN and has been on labetalol 100mg  TID. Over the last week she has seen elevated BP and was admitted yesterday with BPs in the severe range, as high as 180/112 repetitively. Labs revealed a P/C ratio of 9, thrombocytopenia in the 120,000s, slightly elevated AST and ALT, mildly elevated BUN and creatinine, headache and hyperreflexia. Since admission she has been placed on MgSO4, betamethasone, and has received intravenous hydralazine for BP control. She is now on 200mg  labetalol orally TID. Her most recent BP is 117/73. AST and ALT has returned to the high normal range. Platelets have improved to 134,000. Her headache has improved. US showed a baby with normal anatomy, placentation and fluid, but decreased rate of fetal growth with composite EFW in the 18th percentile and AC <3rd percentile  OB History: OB History    Gravida Para Term Preterm AB Living   2       1     SAB TAB Ectopic Multiple Live Births   1              PMH:  Past Medical History:  Diagnosis Date  . Allergy   . Anxiety   . Chronic hypertension   . Depression   . Tracheoesophageal fistula (HCC)     PSH:  Past Surgical History:  Procedure Laterality Date  . DILATION AND CURETTAGE OF UTERUS  01/2013  . ESOPHAGUS SURGERY    . TRACHEAL SURGERY     Meds: See EPIC section Allergies: Latex FH: See EPIC section Soc: See EPIC section  Review of Systems: no vaginal bleeding or cramping/contractions, no LOF, no nausea/vomiting. Minimal headache. Denies RUQ or epigastric pain.  All other systems reviewed and are negative.  PE:  VS: See EPIC section GEN: slightly ill-appearing female ABD: gravid, NT  Please see separate document for fetal ultrasound report.  A/P: This patient clearly has  severe preeclampsia and possibly HELLP syndrome as well. I had a long discussion with the patient and her husband and went over with them the implications of this diagnosis. I emphasized the unpredictability of preeclampsia's course. I also discussed the complications possible form preeclampsia, including seizure, stroke, renal failure, bleeding disorders from thrombocytopenia, hepatic damage.  We then went over the possible course we can take from here. Immediate delivery is an option, although given the improvement in clinical status engendered by the steroids and magnesium, I would advocate waiting for full effect of steroids on the fetus if the family opts for immediate delivery. Conservative management is also an option if the family chooses. This will entail risk to the mother of the aforementioned complications, and I made sure they understood that the occurrence of these complications is unfortunately very unpredictable. The purpose for delaying delivery is of course lessening the burden of prematurity on the fetus. I have asked Dr. Valentino Saxon to order Neonatology consult so that they can get more information about what they can expect from a baby at this EGA, and the increments of improvement we can see with various lengths of delay. For now I would repeat labs at 8 hour intervals (next labs at c. 1300). Should the patient has a return to severe range pressures that cannot be controlled by reasonable doses of parenteral antihypertensives, delivery may be necessary. Similarly worsening thrombocytopenia should prompt delivery as  well. We are available for further involvement in Ms. Boardman's care at your discretion  Thank you for the opportunity to be a part of the care of [facility]. Please contact our office if we can be of further assistance.   I spent approximately 30 minutes with this patient with over 50% of time spent in face-to-face counseling.

## 2016-12-26 NOTE — Progress Notes (Signed)
S: No ctx, no VB, no LOF, good FM. Pt notes mild HA and increase in anxiety due to worrisome converstations with MFM and NICU. Pt did have increase in nausea and emesis after MFM consult. No scotomat, no blurry vision,n o CP, no SOB. No RUQ pain.   O:  Vitals:   12/26/16 1000 12/26/16 1100 12/26/16 1145 12/26/16 1200  BP: 123/82 136/82 137/87   Pulse: 74 75 73 76  Resp:   18   Temp:   97.9 F (36.6 C)   TempSrc:   Oral   SpO2: 97%  99% 99%  Weight:      Height:       Gen emotionaly distressed lyiing in bed with cool towel on forehead CV: RRR Pulm: CTAB Abd: soft, gravid, NT, no RUQ pain LE: 3+ DTR, no clonus, trace edema, SCDs in place GU: def  Toco:none FH: 125s, +10 x10 accels, 10 beat var, no decels  CBC Latest Ref Rng & Units 12/26/2016 12/25/2016 12/25/2016  WBC 4.0 - 10.5 K/uL 8.6 11.4(H) 9.0  Hemoglobin 12.0 - 15.0 g/dL 13.2 13.6 14.0  Hematocrit 36.0 - 46.0 % 37.2 37.9 39.4  Platelets 150 - 400 K/uL 136(L) 128(L) 123(L)   CMP Latest Ref Rng & Units 12/26/2016 12/25/2016 12/25/2016  Glucose 65 - 99 mg/dL 123(H) 94 77  BUN 6 - 20 mg/dL 21(H) 22(H) 24(H)  Creatinine 0.44 - 1.00 mg/dL 1.03(H) 0.94 1.04(H)  Sodium 135 - 145 mmol/L 132(L) 133(L) 134(L)  Potassium 3.5 - 5.1 mmol/L 4.6 4.3 4.5  Chloride 101 - 111 mmol/L 106 106 105  CO2 22 - 32 mmol/L 17(L) 18(L) 21(L)  Calcium 8.9 - 10.3 mg/dL 8.4(L) 9.0 9.3  Total Protein 6.5 - 8.1 g/dL 5.9(L) 5.8(L) 6.3(L)  Total Bilirubin 0.3 - 1.2 mg/dL 0.4 0.4 0.2(L)  Alkaline Phos 38 - 126 U/L 84 84 92  AST 15 - 41 U/L 41 46(H) 58(H)  ALT 14 - 54 U/L 49 54 61(H)   U/s: breech presentation, 543g, 1'3, 18%, AC <3%, nl AFI, cvx 4cm, nl dopplers  A/P: 31 yo G2P0010 at 24'5 with severe PEC and HELLP syndrome in the setting of chronic htn and low fetal growth - PEC. bp under adequate control at this time on labetalol 43m q 8rs, will cont this med, if bp rises will titrate labetalol up to max 800 tid, may need additional IV meds.  Labs elevated but stable, stability [place] be from [place] Plan labs q8rhs then if remain stable may increase to q12 hrs. Sooner labs with sx or worsening maternal status. Would consider delivery with Plt <100, oliguria, worsening renal function. Likely to be more tolerant of increases in LFTs as long as pt without RUQ pain. 24hr protein pending but expect high values due to PrCr ratio. Stop Magnesium tonight at 24hr mark, 2nd BMZ tonight. Pt has met with MFM and understands risks of expectant management. - Fetal well being. NST appropriate for GA. Concern for placental insufficiency given 18% growth and AC <3%. 1hr NST q shift, weekly growth and dopplers. Currently hopeful we can delay delivery until steroid complete. Pt has met with NICU - MOD. PCS given breech and remote from term. D/w pt if plts low will be general anasthesia - Anxiety. Playing a significant part in her bp and HA/ nausea symptoms. Prn ativan. Will resume home meds.  - prophylaxis, protonix, pnv, SCDs.

## 2016-12-26 NOTE — Progress Notes (Signed)
  CBC Latest Ref Rng & Units 12/25/2016 12/25/2016 12/24/2015  WBC 4.0 - 10.5 K/uL 11.4(H) 9.0 5.9  Hemoglobin 12.0 - 15.0 g/dL 13.6 14.0 15.2(H)  Hematocrit 36.0 - 46.0 % 37.9 39.4 44.0  Platelets 150 - 400 K/uL 128(L) 123(L) 192.0   CMP Latest Ref Rng & Units 12/25/2016 12/25/2016 12/24/2015  Glucose 65 - 99 mg/dL 94 77 88  BUN 6 - 20 mg/dL 22(H) 24(H) 14  Creatinine 0.44 - 1.00 mg/dL 0.94 1.04(H) 1.03  Sodium 135 - 145 mmol/L 133(L) 134(L) 140  Potassium 3.5 - 5.1 mmol/L 4.3 4.5 4.5  Chloride 101 - 111 mmol/L 106 105 108  CO2 22 - 32 mmol/L 18(L) 21(L) 26  Calcium 8.9 - 10.3 mg/dL 9.0 9.3 9.8  Total Protein 6.5 - 8.1 g/dL 5.8(L) 6.3(L) 7.1  Total Bilirubin 0.3 - 1.2 mg/dL 0.4 0.2(L) 0.6  Alkaline Phos 38 - 126 U/L 84 92 56  AST 15 - 41 U/L 46(H) 58(H) 16  ALT 14 - 54 U/L 54 61(H) 12    Vitals:   12/25/16 2115 12/25/16 2125 12/25/16 2200 12/25/16 2328  BP: (!) 145/88 (!) 138/92 129/81 (!) 141/96  Pulse: (!) 107 85 75 74  Resp: 20 20 16 16   Temp:    97.8 F (36.6 C)  TempSrc:    Oral  SpO2: 98% 97% 96% 97%  Weight:      Height:        A/P: Severe PEC, probable HELLP CLose expectant management. Repeat labs in 6 hrs. MFM consult in AM. BMZ #2 due tomorrow pm. No indication for immediate delivery at this time.  Angelica [name] A. 12/26/2016 12:24 AM

## 2016-12-27 ENCOUNTER — Inpatient Hospital Stay (HOSPITAL_COMMUNITY): Payer: 59 | Admitting: Anesthesiology

## 2016-12-27 ENCOUNTER — Encounter (HOSPITAL_COMMUNITY)
Admission: AD | Disposition: A | Discharge status: Home / Self Care | Payer: Self-pay | Source: Ambulatory Visit | Attending: Obstetrics

## 2016-12-27 ENCOUNTER — Encounter (HOSPITAL_COMMUNITY): Payer: Self-pay | Admitting: Anesthesiology

## 2016-12-27 DIAGNOSIS — F419 Anxiety disorder, unspecified: Secondary | ICD-10-CM | POA: Diagnosis present

## 2016-12-27 DIAGNOSIS — Z3A25 25 weeks gestation of pregnancy: Secondary | ICD-10-CM | POA: Diagnosis not present

## 2016-12-27 DIAGNOSIS — Z87891 Personal history of nicotine dependence: Secondary | ICD-10-CM | POA: Diagnosis not present

## 2016-12-27 DIAGNOSIS — O9081 Anemia of the puerperium: Secondary | ICD-10-CM | POA: Diagnosis not present

## 2016-12-27 DIAGNOSIS — D62 Acute posthemorrhagic anemia: Secondary | ICD-10-CM | POA: Diagnosis not present

## 2016-12-27 DIAGNOSIS — Z7982 Long term (current) use of aspirin: Secondary | ICD-10-CM | POA: Diagnosis not present

## 2016-12-27 DIAGNOSIS — D696 Thrombocytopenia, unspecified: Secondary | ICD-10-CM | POA: Diagnosis present

## 2016-12-27 DIAGNOSIS — Z6791 Unspecified blood type, Rh negative: Secondary | ICD-10-CM | POA: Diagnosis not present

## 2016-12-27 DIAGNOSIS — O1424 HELLP syndrome, complicating childbirth: Secondary | ICD-10-CM | POA: Diagnosis not present

## 2016-12-27 DIAGNOSIS — O119 Pre-existing hypertension with pre-eclampsia, unspecified trimester: Secondary | ICD-10-CM

## 2016-12-27 DIAGNOSIS — O1002 Pre-existing essential hypertension complicating childbirth: Secondary | ICD-10-CM | POA: Diagnosis not present

## 2016-12-27 DIAGNOSIS — O26893 Other specified pregnancy related conditions, third trimester: Secondary | ICD-10-CM | POA: Diagnosis present

## 2016-12-27 DIAGNOSIS — O9912 Other diseases of the blood and blood-forming organs and certain disorders involving the immune mechanism complicating childbirth: Secondary | ICD-10-CM | POA: Diagnosis present

## 2016-12-27 DIAGNOSIS — O99344 Other mental disorders complicating childbirth: Secondary | ICD-10-CM | POA: Diagnosis present

## 2016-12-27 DIAGNOSIS — O3413 Maternal care for benign tumor of corpus uteri, third trimester: Secondary | ICD-10-CM | POA: Diagnosis present

## 2016-12-27 DIAGNOSIS — D251 Intramural leiomyoma of uterus: Secondary | ICD-10-CM | POA: Diagnosis present

## 2016-12-27 DIAGNOSIS — F329 Major depressive disorder, single episode, unspecified: Secondary | ICD-10-CM | POA: Diagnosis present

## 2016-12-27 DIAGNOSIS — O321XX0 Maternal care for breech presentation, not applicable or unspecified: Secondary | ICD-10-CM | POA: Diagnosis present

## 2016-12-27 LAB — URINE CULTURE

## 2016-12-27 LAB — CBC WITH DIFFERENTIAL/PLATELET
BASOS ABS: 0 10*3/uL (ref 0.0–0.1)
BASOS PCT: 0 %
Basophils Absolute: 0 10*3/uL (ref 0.0–0.1)
Basophils Relative: 0 %
EOS ABS: 0 10*3/uL (ref 0.0–0.7)
EOS ABS: 0 10*3/uL (ref 0.0–0.7)
EOS PCT: 0 %
EOS PCT: 0 %
HCT: 34.9 % — ABNORMAL LOW (ref 36.0–46.0)
HCT: 31.2 % — ABNORMAL LOW (ref 36.0–46.0)
HEMOGLOBIN: 11.1 g/dL — AB (ref 12.0–15.0)
Hemoglobin: 12.1 g/dL (ref 12.0–15.0)
LYMPHS ABS: 1.3 10*3/uL (ref 0.7–4.0)
LYMPHS PCT: 9 %
LYMPHS PCT: 13 %
Lymphs Abs: 0.9 10*3/uL (ref 0.7–4.0)
MCH: 29.7 pg (ref 26.0–34.0)
MCH: 30.7 pg (ref 26.0–34.0)
MCHC: 34.7 g/dL (ref 30.0–36.0)
MCHC: 35.6 g/dL (ref 30.0–36.0)
MCV: 85.7 fL (ref 78.0–100.0)
MCV: 86.4 fL (ref 78.0–100.0)
MONO ABS: 0.2 10*3/uL (ref 0.1–1.0)
MONOS PCT: 4 %
Monocytes Absolute: 0.4 10*3/uL (ref 0.1–1.0)
Monocytes Relative: 2 %
Neutro Abs: 8.8 10*3/uL — ABNORMAL HIGH (ref 1.7–7.7)
Neutro Abs: 7.9 10*3/uL — ABNORMAL HIGH (ref 1.7–7.7)
Neutrophils Relative %: 89 %
Neutrophils Relative %: 83 %
PLATELETS: 137 10*3/uL — AB (ref 150–400)
PLATELETS: 134 10*3/uL — AB (ref 150–400)
RBC: 4.07 MIL/uL (ref 3.87–5.11)
RBC: 3.61 MIL/uL — AB (ref 3.87–5.11)
RDW: 13.3 % (ref 11.5–15.5)
RDW: 13.3 % (ref 11.5–15.5)
WBC: 10 10*3/uL (ref 4.0–10.5)
WBC: 9.6 10*3/uL (ref 4.0–10.5)

## 2016-12-27 LAB — CREATININE CLEARANCE, URINE, 24 HOUR
COLLECTION INTERVAL-CRCL: 24 h
CREAT CLEAR: 63 mL/min — AB (ref 75–115)
Creatinine, 24H Ur: 1069 mg/d (ref 600–1800)
Creatinine, Urine: 92.99 mg/dL
Urine Total Volume-CRCL: 1150 mL

## 2016-12-27 LAB — PROTEIN, URINE, 24 HOUR
Collection Interval-UPROT: 24 hours
PROTEIN, 24H URINE: 3807 mg/d — AB (ref 50–100)
Protein, Urine: 331 mg/dL
URINE TOTAL VOLUME-UPROT: 1150 mL

## 2016-12-27 LAB — COMPREHENSIVE METABOLIC PANEL
ALBUMIN: 2.7 g/dL — AB (ref 3.5–5.0)
ALT: 48 U/L (ref 14–54)
ALT: 44 U/L (ref 14–54)
ANION GAP: 8 (ref 5–15)
AST: 39 U/L (ref 15–41)
AST: 38 U/L (ref 15–41)
Albumin: 2.4 g/dL — ABNORMAL LOW (ref 3.5–5.0)
Alkaline Phosphatase: 74 U/L (ref 38–126)
Alkaline Phosphatase: 74 U/L (ref 38–126)
Anion gap: 7 (ref 5–15)
BUN: 23 mg/dL — ABNORMAL HIGH (ref 6–20)
BUN: 22 mg/dL — AB (ref 6–20)
CHLORIDE: 105 mmol/L (ref 101–111)
CO2: 19 mmol/L — AB (ref 22–32)
CO2: 18 mmol/L — AB (ref 22–32)
CREATININE: 1.26 mg/dL — AB (ref 0.44–1.00)
Calcium: 7.6 mg/dL — ABNORMAL LOW (ref 8.9–10.3)
Calcium: 7.2 mg/dL — ABNORMAL LOW (ref 8.9–10.3)
Chloride: 108 mmol/L (ref 101–111)
Creatinine, Ser: 1.14 mg/dL — ABNORMAL HIGH (ref 0.44–1.00)
GFR calc Af Amer: >60 mL/min (ref >60)
GFR, EST NON AFRICAN AMERICAN: 56 mL/min — AB (ref >60)
Glucose, Bld: 109 mg/dL — ABNORMAL HIGH (ref 65–99)
Glucose, Bld: 107 mg/dL — ABNORMAL HIGH (ref 65–99)
POTASSIUM: 3.9 mmol/L (ref 3.5–5.1)
Potassium: 3.9 mmol/L (ref 3.5–5.1)
SODIUM: 131 mmol/L — AB (ref 135–145)
Sodium: 134 mmol/L — ABNORMAL LOW (ref 135–145)
Total Bilirubin: 0.6 mg/dL (ref 0.3–1.2)
Total Bilirubin: 0.4 mg/dL (ref 0.3–1.2)
Total Protein: 5.4 g/dL — ABNORMAL LOW (ref 6.5–8.1)
Total Protein: 5.1 g/dL — ABNORMAL LOW (ref 6.5–8.1)

## 2016-12-27 LAB — PROTIME-INR
INR: 0.79
PROTHROMBIN TIME: 10.9 s — AB (ref 11.4–15.2)

## 2016-12-27 LAB — APTT: aPTT: 25 seconds (ref 24–36)

## 2016-12-27 SURGERY — Surgical Case
Anesthesia: Regional

## 2016-12-27 MED ORDER — PHENYLEPHRINE 8 MG IN D5W 100 ML (0.08MG/ML) PREMIX OPTIME
INJECTION | INTRAVENOUS | Status: AC
  Filled 2016-12-27: qty 100

## 2016-12-27 MED ORDER — LORAZEPAM 1 MG PO TABS
0.5000 mg | ORAL_TABLET | Freq: Once | ORAL | Status: AC
  Administered 2016-12-27: 0.5 mg via ORAL
  Filled 2016-12-27: qty 1

## 2016-12-27 MED ORDER — LABETALOL HCL 200 MG PO TABS
200.0000 mg | ORAL_TABLET | Freq: Once | ORAL | Status: DC
  Filled 2016-12-27: qty 1

## 2016-12-27 MED ORDER — PHENYLEPHRINE 8 MG IN D5W 100 ML (0.08MG/ML) PREMIX OPTIME
INJECTION | INTRAVENOUS | Status: DC | PRN
  Administered 2016-12-27: 30 ug/min via INTRAVENOUS

## 2016-12-27 MED ORDER — OXYTOCIN 10 UNIT/ML IJ SOLN
INTRAMUSCULAR | Status: AC
  Filled 2016-12-27: qty 4

## 2016-12-27 MED ORDER — OXYTOCIN 10 UNIT/ML IJ SOLN
INTRAVENOUS | Status: DC | PRN
  Administered 2016-12-27: 40 [IU] via INTRAVENOUS

## 2016-12-27 MED ORDER — MEPERIDINE HCL 25 MG/ML IJ SOLN
INTRAMUSCULAR | Status: DC | PRN
  Administered 2016-12-27 – 2016-12-28 (×2): 12.5 mg via INTRAVENOUS

## 2016-12-27 MED ORDER — ONDANSETRON HCL 4 MG/2ML IJ SOLN
INTRAMUSCULAR | Status: AC
  Filled 2016-12-27: qty 2

## 2016-12-27 MED ORDER — DEXAMETHASONE SODIUM PHOSPHATE 4 MG/ML IJ SOLN
INTRAMUSCULAR | Status: DC | PRN
  Administered 2016-12-27: 10 mg via INTRAVENOUS

## 2016-12-27 MED ORDER — SCOPOLAMINE 1 MG/3DAYS TD PT72
MEDICATED_PATCH | TRANSDERMAL | Status: AC
  Filled 2016-12-27: qty 1

## 2016-12-27 MED ORDER — SODIUM CHLORIDE 0.9 % IV SOLN
250.0000 mL | INTRAVENOUS | Status: DC | PRN
  Administered 2016-12-27: 250 mL via INTRAVENOUS

## 2016-12-27 MED ORDER — SOD CITRATE-CITRIC ACID 500-334 MG/5ML PO SOLN
30.0000 mL | Freq: Once | ORAL | Status: AC
  Administered 2016-12-27: 30 mL via ORAL

## 2016-12-27 MED ORDER — SCOPOLAMINE 1 MG/3DAYS TD PT72
MEDICATED_PATCH | TRANSDERMAL | Status: DC | PRN
  Administered 2016-12-27: 1 via TRANSDERMAL

## 2016-12-27 MED ORDER — METOCLOPRAMIDE HCL 5 MG/ML IJ SOLN
INTRAMUSCULAR | Status: DC | PRN
  Administered 2016-12-27: 10 mg via INTRAVENOUS

## 2016-12-27 MED ORDER — DEXAMETHASONE SODIUM PHOSPHATE 10 MG/ML IJ SOLN
INTRAMUSCULAR | Status: AC
  Filled 2016-12-27: qty 1

## 2016-12-27 MED ORDER — LORAZEPAM 2 MG/ML IJ SOLN: 0.5000 mg | Freq: Once | INTRAMUSCULAR | Status: AC

## 2016-12-27 MED ORDER — ACETAMINOPHEN 500 MG PO TABS
1000.0000 mg | ORAL_TABLET | Freq: Once | ORAL | Status: AC
  Administered 2016-12-27: 1000 mg via ORAL
  Filled 2016-12-27: qty 2

## 2016-12-27 MED ORDER — ONDANSETRON HCL 4 MG/2ML IJ SOLN
INTRAMUSCULAR | Status: DC | PRN
  Administered 2016-12-27: 4 mg via INTRAVENOUS

## 2016-12-27 MED ORDER — METOCLOPRAMIDE HCL 5 MG/ML IJ SOLN
INTRAMUSCULAR | Status: AC
  Filled 2016-12-27: qty 2

## 2016-12-27 MED ORDER — SOD CITRATE-CITRIC ACID 500-334 MG/5ML PO SOLN
ORAL | Status: AC
  Filled 2016-12-27: qty 15

## 2016-12-27 MED ORDER — CEFAZOLIN SODIUM-DEXTROSE 2-4 GM/100ML-% IV SOLN
2.0000 g | Freq: Once | INTRAVENOUS | Status: AC
  Administered 2016-12-27: 2 g via INTRAVENOUS
  Filled 2016-12-27: qty 100

## 2016-12-27 MED ORDER — MEPERIDINE HCL 25 MG/ML IJ SOLN
INTRAMUSCULAR | Status: AC
  Filled 2016-12-27: qty 1

## 2016-12-27 MED ORDER — MORPHINE SULFATE (PF) 0.5 MG/ML IJ SOLN
INTRAMUSCULAR | Status: AC
  Filled 2016-12-27: qty 10

## 2016-12-27 SURGICAL SUPPLY — 38 items
APL SKNCLS STERI-STRIP NONHPOA (GAUZE/BANDAGES/DRESSINGS) ×1
BENZOIN TINCTURE PRP APPL 2/3 (GAUZE/BANDAGES/DRESSINGS) ×2 IMPLANT
CHLORAPREP W/TINT 26ML (MISCELLANEOUS) ×3 IMPLANT
CLAMP CORD UMBIL (MISCELLANEOUS) IMPLANT
CLOSURE STERI STRIP 1/2 X4 (GAUZE/BANDAGES/DRESSINGS) ×2 IMPLANT
CLOSURE WOUND 1/2 X4 (GAUZE/BANDAGES/DRESSINGS)
CLOTH BEACON ORANGE TIMEOUT ST (SAFETY) ×3 IMPLANT
CONTAINER PREFILL 10% NBF 15ML (MISCELLANEOUS) IMPLANT
DRSG OPSITE POSTOP 4X10 (GAUZE/BANDAGES/DRESSINGS) ×3 IMPLANT
ELECT REM PT RETURN 9FT ADLT (ELECTROSURGICAL) ×3
ELECTRODE REM PT RTRN 9FT ADLT (ELECTROSURGICAL) ×1 IMPLANT
EXTRACTOR VACUUM M CUP 4 TUBE (SUCTIONS) IMPLANT
EXTRACTOR VACUUM M CUP 4' TUBE (SUCTIONS)
GLOVE BIO SURGEON STRL SZ 6.5 (GLOVE) ×2 IMPLANT
GLOVE BIO SURGEONS STRL SZ 6.5 (GLOVE) ×1
GLOVE BIOGEL PI IND STRL 7.0 (GLOVE) ×2 IMPLANT
GLOVE BIOGEL PI INDICATOR 7.0 (GLOVE) ×4
GOWN STRL REUS W/TWL LRG LVL3 (GOWN DISPOSABLE) ×6 IMPLANT
KIT ABG SYR 3ML LUER SLIP (SYRINGE) IMPLANT
NDL HYPO 25X5/8 SAFETYGLIDE (NEEDLE) IMPLANT
NEEDLE HYPO 22GX1.5 SAFETY (NEEDLE) IMPLANT
NEEDLE HYPO 25X5/8 SAFETYGLIDE (NEEDLE) IMPLANT
NS IRRIG 1000ML POUR BTL (IV SOLUTION) ×3 IMPLANT
PACK C SECTION WH (CUSTOM PROCEDURE TRAY) ×3 IMPLANT
PAD OB MATERNITY 4.3X12.25 (PERSONAL CARE ITEMS) ×3 IMPLANT
PENCIL SMOKE EVAC W/HOLSTER (ELECTROSURGICAL) ×3 IMPLANT
STRIP CLOSURE SKIN 1/2X4 (GAUZE/BANDAGES/DRESSINGS) IMPLANT
SUT MON AB 4-0 PS1 27 (SUTURE) ×3 IMPLANT
SUT PLAIN 0 NONE (SUTURE) IMPLANT
SUT PLAIN 2 0 XLH (SUTURE) IMPLANT
SUT VIC AB 0 CT1 36 (SUTURE) ×6 IMPLANT
SUT VIC AB 0 CTX 36 (SUTURE) ×15
SUT VIC AB 0 CTX36XBRD ANBCTRL (SUTURE) ×2 IMPLANT
SUT VIC AB 2-0 CT1 27 (SUTURE) ×3
SUT VIC AB 2-0 CT1 TAPERPNT 27 (SUTURE) ×1 IMPLANT
SYR CONTROL 10ML LL (SYRINGE) IMPLANT
TOWEL OR 17X24 6PK STRL BLUE (TOWEL DISPOSABLE) ×3 IMPLANT
TRAY FOLEY BAG SILVER LF 14FR (SET/KITS/TRAYS/PACK) IMPLANT

## 2016-12-27 NOTE — Progress Notes (Signed)
HD#3 25'0  S: No ctx, no VB, no LOF, good FM. Pt notes mild HA 6/10 upon awakening, slight increase in bp, HA now only 2/10 after po labetalol, tyelonl and poral ativan. Pt does note increase in anxiety this am. No current emesis, no RUQ pain.  No scotomat, no blurry vision,n o CP, no SOB. No RUQ pain.   O:  Vitals:   12/27/16 0849 12/27/16 0850 12/27/16 0942 12/27/16 1030  BP:  (!) 145/93 (!) 149/97 132/87  Pulse:  73 72 73  Resp: _0 Temp: 98.3 F (36.8 C)     TempSrc: Oral     SpO2:      Weight:      Height:       Gen emotionaly distressed lyiing in bed with cool towel on forehead CV: RRR Pulm: CTAB Abd: soft, gravid, NT, no RUQ pain LE: 3+ DTR, 1 beat clonus, trace edema, SCDs in place GU: def  Toco:none FH: 125s, +10 x10 accels, 10 beat var, no decels  CBC Latest Ref Rng & Units 12/27/2016 12/26/2016 12/26/2016  WBC 4.0 - 10.5 K/uL 10.0 8.9 8.8  Hemoglobin 12.0 - 15.0 g/dL 12.1 12.6 12.8  Hematocrit 36.0 - 46.0 % 34.9(L) 36.1 36.4  Platelets 150 - 400 K/uL 137(L) 141(L) 134(L)   CMP Latest Ref Rng & Units 12/27/2016 12/26/2016 12/26/2016  Glucose 65 - 99 mg/dL 109(H) 136(H) 142(H)  BUN 6 - 20 mg/dL 23(H) 20 19  Creatinine 0.44 - 1.00 mg/dL 1.14(H) 1.18(H) 1.07(H)  Sodium 135 - 145 mmol/L 131(L) 132(L) 127(L)  Potassium 3.5 - 5.1 mmol/L 3.9 4.0 3.8  Chloride 101 - 111 mmol/L 105 104 100(L)  CO2 22 - 32 mmol/L 19(L) 19(L) 18(L)  Calcium 8.9 - 10.3 mg/dL 7.6(L) 7.8(L) 7.5(L)  Total Protein 6.5 - 8.1 g/dL 5.4(L) 5.4(L) 5.2(L)  Total Bilirubin 0.3 - 1.2 mg/dL 0.6 0.1(L) 0.4  Alkaline Phos 38 - 126 U/L 74 79 80  AST 15 - 41 U/L 39 44(H) 38  ALT 14 - 54 U/L 48 49 46   U/s: breech presentation, 543g, 1'3, 18%, AC <3%, nl AFI, cvx 4cm, nl dopplers  A/P: 31 yo G2P0010 at 25'0  with severe PEC and HELLP syndrome in the setting of chronic htn and low fetal growth - PEC. bp under adequate control at this time on labetalol 455m q 8rs, will cont this med, if bp rises  will titrate labetalol up to max 800 tid, may need additional IV meds. Labs elevated but stable, stability may be from [place] Plan labs q12rhs then if remain stable may increase to daily.  Sooner labs with sx or worsening maternal status. Would consider delivery with Plt <100, oliguria, worsening renal function. Likely to be more tolerant of increases in LFTs as long as pt without RUQ pain. 24hr protein with 3+ grams and only 1100 ml urine output- likely trending towards worsening renal function and oliguria. Need to be careful about kidney function in this pt with pre-existing htn. This may be the indicator for delivery. Strinct I/O's. BMZ complete aroun 9pm. Pt understands R/B ratio changes after BMZ complete and lower threshold for delivery, even at this early GA. Pt has met with MFM and understands risks of expectant management. At this time still planning expectant management.  - Fetal well being. NST appropriate for GA. Concern for placental insufficiency given 18% growth and AC <3%. 1hr NST q shift, weekly growth and dopplers. Currently hopeful we can delay delivery until  steroid complete. Pt has met with NICU - MOD. PCS given breech and remote from term. D/w pt if plts low will be general anasthesia - Anxiety. Playing a significant part in her bp and HA/ nausea symptoms. Prn ativan. Will resume home meds.  - prophylaxis, protonix, pnv, SCDs.  FOGLEMAN,KELLY A. 12/27/2016 11:11 AM   

## 2016-12-27 NOTE — Progress Notes (Signed)
Pt in bed to or with husband with rn and nurse tech. Stable, weepy at times

## 2016-12-27 NOTE — Progress Notes (Signed)
S: Pt notes continued mild HA but overall doing much better  O  Vitals:   12/27/16 1503 12/27/16 1551 12/27/16 1950 12/27/16 2145  BP: 130/87 139/87 140/90 (!) 146/96  Pulse: 74 69 75 82  Resp: 18  17 17   Temp: 97.7 F (36.5 C)  98.3 F (36.8 C)   TempSrc: Oral  Oral   SpO2: 99%  98% 98%  Weight:      Height:       Gen: anxious but no distress  CMP     Component Value Date/Time   NA 134 (L) 12/27/2016 2021   NA 141 01/08/2015   K 3.9 12/27/2016 2021   CL 108 12/27/2016 2021   CO2 18 (L) 12/27/2016 2021   GLUCOSE 107 (H) 12/27/2016 2021   BUN 22 (H) 12/27/2016 2021   BUN 12 01/08/2015   CREATININE 1.26 (H) 12/27/2016 2021   CALCIUM 7.2 (L) 12/27/2016 2021   PROT 5.1 (L) 12/27/2016 2021   ALBUMIN 2.4 (L) 12/27/2016 2021   AST 38 12/27/2016 2021   ALT 44 12/27/2016 2021   ALKPHOS 74 12/27/2016 2021   BILITOT 0.4 12/27/2016 2021   GFRNONAA 56 (L) 12/27/2016 2021   GFRAA >60 12/27/2016 2021    CBC    Component Value Date/Time   WBC 9.6 12/27/2016 2021   RBC 3.61 (L) 12/27/2016 2021   HGB 11.1 (L) 12/27/2016 2021   HCT 31.2 (L) 12/27/2016 2021   PLT 134 (L) 12/27/2016 2021   MCV 86.4 12/27/2016 2021   MCV 87.4 05/15/2011 1155   MCH 30.7 12/27/2016 2021   MCHC 35.6 12/27/2016 2021   RDW 13.3 12/27/2016 2021   LYMPHSABS 1.3 12/27/2016 2021   MONOABS 0.4 12/27/2016 2021   EOSABS 0.0 12/27/2016 2021   BASOSABS 0.0 12/27/2016 2021     A/P Worsening severe PEC with renal dysfunction. Cr has risen to 1.26, pt is 50 hrs from first BMZ dose, s/p 24 hrs Mag. Given severity and worsening of PEC, recc PCS. Discussed case with Dr. Margurite Auerbach, MFM who recommends this plan as well. Pt and husband agree. Consented for blood if needed. R/B c/s d/w pt. Pt already aware risks to baby at this early GA. Will plan Mag and serial labs PP.   Marnell Mcdaniel A. 12/27/2016 10:02 PM

## 2016-12-27 NOTE — Anesthesia Preprocedure Evaluation (Signed)
Anesthesia Evaluation  Patient identified by MRN, date of birth, ID band Patient awake    Reviewed: Allergy & Precautions, NPO status , Patient's Chart, lab work & pertinent test results  Airway Mallampati: II  TM Distance: >3 FB Neck ROM: Full    Dental no notable dental hx.    Pulmonary neg pulmonary ROS, former smoker,    Pulmonary exam normal breath sounds clear to auscultation       Cardiovascular hypertension, negative cardio ROS Normal cardiovascular exam Rhythm:Regular Rate:Normal     Neuro/Psych negative neurological ROS  negative psych ROS   GI/Hepatic negative GI ROS, HELLP   Endo/Other  negative endocrine ROS  Renal/GU negative Renal ROS  negative genitourinary   Musculoskeletal negative musculoskeletal ROS (+)   Abdominal   Peds negative pediatric ROS (+)  Hematology negative hematology ROS (+)   Anesthesia Other Findings   Reproductive/Obstetrics negative OB ROS                             Anesthesia Physical Anesthesia Plan  ASA: IV  Anesthesia Plan:    Post-op Pain Management:    Induction:   PONV Risk Score and Plan: 4 or greater and Ondansetron, Dexamethasone, Scopolamine patch - Pre-op and Treatment may vary due to age or medical condition  Airway Management Planned:   Additional Equipment:   Intra-op Plan:   Post-operative Plan:   Informed Consent: I have reviewed the patients History and Physical, chart, labs and discussed the procedure including the risks, benefits and alternatives for the proposed anesthesia with the patient or authorized representative who has indicated his/her understanding and acceptance.   Dental advisory given  Plan Discussed with: CRNA  Anesthesia Plan Comments:         Anesthesia Quick Evaluation

## 2016-12-28 ENCOUNTER — Encounter (HOSPITAL_COMMUNITY): Payer: Self-pay | Admitting: Obstetrics

## 2016-12-28 LAB — COMPREHENSIVE METABOLIC PANEL
ALBUMIN: 2.5 g/dL — AB (ref 3.5–5.0)
ALK PHOS: 67 U/L (ref 38–126)
ALT: 44 U/L (ref 14–54)
AST: 41 U/L (ref 15–41)
Anion gap: 6 (ref 5–15)
BILIRUBIN TOTAL: 0.4 mg/dL (ref 0.3–1.2)
BUN: 20 mg/dL (ref 6–20)
CO2: 20 mmol/L — AB (ref 22–32)
CREATININE: 1.15 mg/dL — AB (ref 0.44–1.00)
Calcium: 7.2 mg/dL — ABNORMAL LOW (ref 8.9–10.3)
Chloride: 107 mmol/L (ref 101–111)
GFR calc Af Amer: >60 mL/min (ref >60)
GFR calc non Af Amer: >60 mL/min (ref >60)
GLUCOSE: 114 mg/dL — AB (ref 65–99)
Potassium: 4.7 mmol/L (ref 3.5–5.1)
SODIUM: 133 mmol/L — AB (ref 135–145)
TOTAL PROTEIN: 5 g/dL — AB (ref 6.5–8.1)

## 2016-12-28 LAB — CBC
HEMATOCRIT: 32 % — AB (ref 36.0–46.0)
HEMOGLOBIN: 11.1 g/dL — AB (ref 12.0–15.0)
MCH: 30.2 pg (ref 26.0–34.0)
MCHC: 34.7 g/dL (ref 30.0–36.0)
MCV: 87 fL (ref 78.0–100.0)
Platelets: 161 10*3/uL (ref 150–400)
RBC: 3.68 MIL/uL — AB (ref 3.87–5.11)
RDW: 13.5 % (ref 11.5–15.5)
WBC: 15.6 10*3/uL — ABNORMAL HIGH (ref 4.0–10.5)

## 2016-12-28 LAB — RPR: RPR Ser Ql: NONREACTIVE

## 2016-12-28 MED ORDER — HYDROMORPHONE HCL 1 MG/ML IJ SOLN
0.2500 mg | INTRAMUSCULAR | Status: DC | PRN
  Administered 2016-12-28: 0.5 mg via INTRAVENOUS

## 2016-12-28 MED ORDER — DIBUCAINE 1 % RE OINT: 1.0000 "application " | TOPICAL_OINTMENT | RECTAL | Status: DC | PRN

## 2016-12-28 MED ORDER — TETANUS-DIPHTH-ACELL PERTUSSIS 5-2.5-18.5 LF-MCG/0.5 IM SUSP
0.5000 mL | Freq: Once | INTRAMUSCULAR | Status: AC
  Administered 2016-12-31: 0.5 mL via INTRAMUSCULAR

## 2016-12-28 MED ORDER — OXYCODONE HCL 5 MG PO TABS: 5.0000 mg | ORAL_TABLET | Freq: Once | ORAL | Status: DC | PRN

## 2016-12-28 MED ORDER — PROMETHAZINE HCL 25 MG/ML IJ SOLN: 6.2500 mg | INTRAMUSCULAR | Status: DC | PRN

## 2016-12-28 MED ORDER — OXYCODONE-ACETAMINOPHEN 5-325 MG PO TABS
1.0000 | ORAL_TABLET | ORAL | Status: DC | PRN
Start: 2016-12-28 — End: 2016-12-31
  Administered 2016-12-28 – 2016-12-31 (×2): 1 via ORAL
  Filled 2016-12-28 (×3): qty 1

## 2016-12-28 MED ORDER — MEPERIDINE HCL 25 MG/ML IJ SOLN: 6.2500 mg | INTRAMUSCULAR | Status: DC | PRN

## 2016-12-28 MED ORDER — DIPHENHYDRAMINE HCL 25 MG PO CAPS: 25.0000 mg | ORAL_CAPSULE | Freq: Four times a day (QID) | ORAL | Status: DC | PRN

## 2016-12-28 MED ORDER — NALBUPHINE HCL 10 MG/ML IJ SOLN: 5.0000 mg | INTRAMUSCULAR | Status: DC | PRN

## 2016-12-28 MED ORDER — RHO D IMMUNE GLOBULIN 1500 UNIT/2ML IJ SOSY
300.0000 ug | PREFILLED_SYRINGE | Freq: Once | INTRAMUSCULAR | Status: AC
  Administered 2016-12-28: 300 ug via INTRAVENOUS
  Filled 2016-12-28: qty 2

## 2016-12-28 MED ORDER — MEASLES, MUMPS & RUBELLA VAC ~~LOC~~ INJ
0.5000 mL | INJECTION | Freq: Once | SUBCUTANEOUS | Status: AC
  Administered 2016-12-31: 0.5 mL via SUBCUTANEOUS
  Filled 2016-12-28: qty 0.5

## 2016-12-28 MED ORDER — SIMETHICONE 80 MG PO CHEW
80.0000 mg | CHEWABLE_TABLET | ORAL | Status: DC
  Administered 2016-12-28 – 2016-12-30 (×3): 80 mg via ORAL
  Filled 2016-12-28 (×3): qty 1

## 2016-12-28 MED ORDER — OXYCODONE-ACETAMINOPHEN 5-325 MG PO TABS
2.0000 | ORAL_TABLET | ORAL | Status: DC | PRN
  Administered 2016-12-28 – 2016-12-31 (×8): 2 via ORAL
  Filled 2016-12-28 (×8): qty 2

## 2016-12-28 MED ORDER — SCOPOLAMINE 1 MG/3DAYS TD PT72: 1.0000 | MEDICATED_PATCH | Freq: Once | TRANSDERMAL | Status: DC

## 2016-12-28 MED ORDER — LORAZEPAM 2 MG/ML IJ SOLN
0.5000 mg | Freq: Four times a day (QID) | INTRAMUSCULAR | Status: DC | PRN
  Filled 2016-12-28: qty 0.25

## 2016-12-28 MED ORDER — BUSPIRONE HCL 15 MG PO TABS
15.0000 mg | ORAL_TABLET | Freq: Three times a day (TID) | ORAL | Status: DC
  Administered 2016-12-28 – 2016-12-31 (×10): 15 mg via ORAL
  Filled 2016-12-28 (×10): qty 1

## 2016-12-28 MED ORDER — OXYTOCIN 40 UNITS IN LACTATED RINGERS INFUSION - SIMPLE MED: 2.5000 [IU]/h | INTRAVENOUS | Status: AC

## 2016-12-28 MED ORDER — DIPHENHYDRAMINE HCL 25 MG PO CAPS
25.0000 mg | ORAL_CAPSULE | ORAL | Status: DC | PRN
  Filled 2016-12-28: qty 1

## 2016-12-28 MED ORDER — NALBUPHINE HCL 10 MG/ML IJ SOLN: 5.0000 mg | Freq: Once | INTRAMUSCULAR | Status: DC | PRN

## 2016-12-28 MED ORDER — LACTATED RINGERS IV SOLN
INTRAVENOUS | Status: DC
  Administered 2016-12-28: 08:00:00 via INTRAVENOUS

## 2016-12-28 MED ORDER — MENTHOL 3 MG MT LOZG: 1.0000 | LOZENGE | OROMUCOSAL | Status: DC | PRN

## 2016-12-28 MED ORDER — NALOXONE HCL 2 MG/2ML IJ SOSY
1.0000 ug/kg/h | PREFILLED_SYRINGE | INTRAMUSCULAR | Status: DC | PRN
Start: 2016-12-28 — End: 2016-12-30
  Filled 2016-12-28: qty 2

## 2016-12-28 MED ORDER — MAGNESIUM SULFATE BOLUS VIA INFUSION
4.0000 g | Freq: Once | INTRAVENOUS | Status: AC
  Administered 2016-12-28: 4 g via INTRAVENOUS
  Filled 2016-12-28: qty 500

## 2016-12-28 MED ORDER — SODIUM CHLORIDE 0.9% FLUSH: 3.0000 mL | INTRAVENOUS | Status: DC | PRN

## 2016-12-28 MED ORDER — ZOLPIDEM TARTRATE 5 MG PO TABS: 5.0000 mg | ORAL_TABLET | Freq: Every evening | ORAL | Status: DC | PRN

## 2016-12-28 MED ORDER — PRENATAL PLUS 27-1 MG PO TABS
1.0000 | ORAL_TABLET | Freq: Every day | ORAL | Status: DC
  Administered 2016-12-30 – 2016-12-31 (×2): 1 via ORAL
  Filled 2016-12-28 (×3): qty 1

## 2016-12-28 MED ORDER — MAGNESIUM SULFATE 40 G IN LACTATED RINGERS - SIMPLE
2.0000 g/h | INTRAVENOUS | Status: DC
  Administered 2016-12-28: 2 g/h via INTRAVENOUS
  Filled 2016-12-28: qty 40
  Filled 2016-12-28: qty 500

## 2016-12-28 MED ORDER — HYDROMORPHONE HCL 1 MG/ML IJ SOLN
INTRAMUSCULAR | Status: AC
Start: 2016-12-28 — End: 2016-12-28
  Filled 2016-12-28: qty 0.5

## 2016-12-28 MED ORDER — NALOXONE HCL 0.4 MG/ML IJ SOLN: 0.4000 mg | INTRAMUSCULAR | Status: DC | PRN

## 2016-12-28 MED ORDER — SENNOSIDES-DOCUSATE SODIUM 8.6-50 MG PO TABS
2.0000 | ORAL_TABLET | ORAL | Status: DC
  Administered 2016-12-28 – 2016-12-30 (×3): 2 via ORAL
  Filled 2016-12-28 (×3): qty 2

## 2016-12-28 MED ORDER — ONDANSETRON HCL 4 MG/2ML IJ SOLN: 4.0000 mg | Freq: Three times a day (TID) | INTRAMUSCULAR | Status: DC | PRN

## 2016-12-28 MED ORDER — LORATADINE 10 MG PO TABS
10.0000 mg | ORAL_TABLET | Freq: Every day | ORAL | Status: DC | PRN
  Filled 2016-12-28: qty 1

## 2016-12-28 MED ORDER — SERTRALINE HCL 100 MG PO TABS
200.0000 mg | ORAL_TABLET | Freq: Every day | ORAL | Status: DC
  Administered 2016-12-28 – 2016-12-31 (×4): 200 mg via ORAL
  Filled 2016-12-28 (×4): qty 2

## 2016-12-28 MED ORDER — LABETALOL HCL 200 MG PO TABS
200.0000 mg | ORAL_TABLET | Freq: Three times a day (TID) | ORAL | Status: DC
  Administered 2016-12-28 – 2016-12-31 (×9): 200 mg via ORAL
  Filled 2016-12-28 (×9): qty 1

## 2016-12-28 MED ORDER — DIPHENHYDRAMINE HCL 50 MG/ML IJ SOLN
INTRAMUSCULAR | Status: AC
  Filled 2016-12-28: qty 1

## 2016-12-28 MED ORDER — COCONUT OIL OIL
1.0000 "application " | TOPICAL_OIL | Status: DC | PRN
  Administered 2016-12-28: 1 via TOPICAL
  Filled 2016-12-28: qty 120

## 2016-12-28 MED ORDER — ACETAMINOPHEN 325 MG PO TABS
650.0000 mg | ORAL_TABLET | ORAL | Status: DC | PRN
Start: 2016-12-28 — End: 2016-12-31

## 2016-12-28 MED ORDER — SIMETHICONE 80 MG PO CHEW: 80.0000 mg | CHEWABLE_TABLET | ORAL | Status: DC | PRN

## 2016-12-28 MED ORDER — PRENATAL MULTIVITAMIN CH
1.0000 | ORAL_TABLET | Freq: Every day | ORAL | Status: DC
  Administered 2016-12-28: 1 via ORAL
  Filled 2016-12-28: qty 1

## 2016-12-28 MED ORDER — WITCH HAZEL-GLYCERIN EX PADS: 1.0000 "application " | MEDICATED_PAD | CUTANEOUS | Status: DC | PRN

## 2016-12-28 MED ORDER — LORAZEPAM 1 MG PO TABS
0.5000 mg | ORAL_TABLET | Freq: Four times a day (QID) | ORAL | Status: DC | PRN
  Administered 2016-12-29 – 2016-12-30 (×3): 0.5 mg via ORAL
  Filled 2016-12-28 (×5): qty 1

## 2016-12-28 MED ORDER — SIMETHICONE 80 MG PO CHEW
80.0000 mg | CHEWABLE_TABLET | Freq: Three times a day (TID) | ORAL | Status: DC
  Administered 2016-12-28 – 2016-12-31 (×9): 80 mg via ORAL
  Filled 2016-12-28 (×9): qty 1

## 2016-12-28 MED ORDER — OXYCODONE HCL 5 MG/5ML PO SOLN: 5.0000 mg | Freq: Once | ORAL | Status: DC | PRN

## 2016-12-28 MED ORDER — DIPHENHYDRAMINE HCL 50 MG/ML IJ SOLN
12.5000 mg | INTRAMUSCULAR | Status: DC | PRN
  Administered 2016-12-28 (×2): 12.5 mg via INTRAVENOUS
  Filled 2016-12-28: qty 1

## 2016-12-28 MED ORDER — IBUPROFEN 600 MG PO TABS
600.0000 mg | ORAL_TABLET | Freq: Four times a day (QID) | ORAL | Status: DC
Start: 2016-12-28 — End: 2016-12-31
  Administered 2016-12-28 – 2016-12-31 (×14): 600 mg via ORAL
  Filled 2016-12-28 (×14): qty 1

## 2016-12-28 MED ORDER — LACTATED RINGERS IV SOLN
INTRAVENOUS | Status: DC
  Administered 2016-12-28 – 2016-12-29 (×2): via INTRAVENOUS

## 2016-12-28 NOTE — Progress Notes (Signed)
S: Pt notes no HA, had some sleep, feels "groggy from Mag", no ambulation yet, saw baby once- hopeful, no gas, trial po just now, no CP, no SOB  O:  Vitals:   12/28/16 0530 12/28/16 0633 12/28/16 0747 12/28/16 0905  BP: 125/74 132/72 (!) 132/96 139/83  Pulse: (!) 59 68 (!) 58 65  Resp: 18 18 16 17   Temp:   99.1 F (37.3 C) 98.9 F (37.2 C)  TempSrc:    Oral  SpO2: 94% 95%  97%  Weight:      Height:       Gen: generalized edema, less anxious than prior CV: RRR Pulm: CTAB, no crackles Abd: soft, appropriately tender, fundus firm and below umbilicus but difficult to palpate Inc: small staining on pad LE: SCDs, 1+ edema, no clonus, 3+ DTR GU" def  CBC Latest Ref Rng & Units 12/28/2016 12/27/2016 12/27/2016  WBC 4.0 - 10.5 K/uL 15.6(H) 9.6 10.0  Hemoglobin 12.0 - 15.0 g/dL 11.1(L) 11.1(L) 12.1  Hematocrit 36.0 - 46.0 % 32.0(L) 31.2(L) 34.9(L)  Platelets 150 - 400 K/uL 161 134(L) 137(L)   CMP Latest Ref Rng & Units 12/28/2016 12/27/2016 12/27/2016  Glucose 65 - 99 mg/dL 114(H) 107(H) 109(H)  BUN 6 - 20 mg/dL 20 22(H) 23(H)  Creatinine 0.44 - 1.00 mg/dL 1.15(H) 1.26(H) 1.14(H)  Sodium 135 - 145 mmol/L 133(L) 134(L) 131(L)  Potassium 3.5 - 5.1 mmol/L 4.7 3.9 3.9  Chloride 101 - 111 mmol/L 107 108 105  CO2 22 - 32 mmol/L 20(L) 18(L) 19(L)  Calcium 8.9 - 10.3 mg/dL 7.2(L) 7.2(L) 7.6(L)  Total Protein 6.5 - 8.1 g/dL 5.0(L) 5.1(L) 5.4(L)  Total Bilirubin 0.3 - 1.2 mg/dL 0.4 0.4 0.6  Alkaline Phos 38 - 126 U/L 67 74 74  AST 15 - 41 U/L 41 38 39  ALT 14 - 54 U/L 44 44 48    A/P: POD #1 s/p classical c/s at 25 wks for worsening severe PEC, BMZ complete - post-op. Recovering as expected, expect more pain later today as spinal wears off. Advance diet, remove fley once ambulating. - severe PEC. On Mag, possible d/c late tonight but more likely tomorrow am, await diuresis. Repeat labs in am as recovering. Continue labetalol. As pulse and bp lowering, will move back to 200mg  tid.   -  baby in NICU, initially hopeful but NICU just called and wanted mom in NICU ASAP, expect neonatal decompensation at this moment.  - anxiety- sertraline, buspar, prn ativan.   Angelica [name] A. 12/28/2016 11:36 AM

## 2016-12-28 NOTE — Progress Notes (Signed)
Foley catheter d/c at 1815 by Janett Billow walls rn

## 2016-12-28 NOTE — Progress Notes (Signed)
Pt returned from NICU via bed with rn and NT.

## 2016-12-28 NOTE — Progress Notes (Signed)
Pt to NICU in bed with RN and NT and husband to see infant.

## 2016-12-28 NOTE — Op Note (Signed)
12/25/2016 - 12/28/2016  12:26 AM  PATIENT:  Angelica [name]  31 y.o. female  PRE-OPERATIVE DIAGNOSIS:  CESAREAN SECTION for worsening severe preeclampsia remote from delivery with an unfavorable cervix  POST-OPERATIVE DIAGNOSIS:  CESAREAN SECTION severe pre- eclampsia  for worsening severe preeclampsia remote from delivery with an unfavorable cervix; uterine fibroid   PROCEDURE:  Procedure(s): CESAREAN SECTION (N/A)  Classical cesarean section myomectomy  SURGEON:  Surgeon(s) and Role:    * Aloha Gell, MD - Primary  PHYSICIAN ASSISTANT:   ASSISTANTS: Derrell Lolling, CNM  ANESTHESIA:   spinal  EBL:  851 mL   BLOOD ADMINISTERED:none  DRAINS: Urinary Catheter (Foley)   LOCAL MEDICATIONS USED:  NONE  SPECIMEN:  Source of Specimen:  Placenta  DISPOSITION OF SPECIMEN:  Labor and delivery  COUNTS:  YES  TOURNIQUET:  * No tourniquets in log *  DICTATION: .Note written in EPIC  PLAN OF CARE: Admit to inpatient   PATIENT DISPOSITION:  PACU - hemodynamically stable.   Delay start of Pharmacological VTE agent (>24hrs) due to surgical blood loss or risk of bleeding: yes   Findings:  Severely premature breech female infant needing resuscitation from NICU staff, 1 cm intramural fibroid, normal tubes and ovaries, clear amniotic fluid, nuchal cord 1    EBL: 850 cc Antibiotics:   2g Ancef Complications: none  Indications: This is a 31 y.o. year-old, G2P0  At [redacted]w[redacted]d admitted for severe preeclampsia. Patient was watched closely with expectant management while betamethasone was being given. She also received 24 hours of magnesium sulfate. Her blood pressure remained elevated but stable on 400 mg labetalol every 8 hours and while her LFTs were elevated and her platelets were low and they remained stable during her hospital stay. Her creatinine continued to rise and this evening after she was steroid complete her creatinine rose to greater than 1.2 and the decision was made  for delivery. Risks benefits and alternatives of the procedure were discussed with the patient who agreed to proceed  Procedure:  After informed consent was obtained the patient was taken to the operating room where spinal anesthesia was initiated.  She was prepped and draped in the normal sterile fashion in dorsal supine position with a leftward tilt.  A foley catheter was in place.  A Pfannenstiel skin incision was made 2 cm above the pubic symphysis in the midline with the scalpel.  Dissection was carried down with the Bovie cautery until the fascia was reached. The fascia was incised in the midline. The incision was extended laterally with the Mayo scissors. The inferior aspect of the fascial incision was grasped with the Coker clamps, elevated up and the underlying rectus muscles were dissected off sharply. The superior aspect of the fascial incision was grasped with the Coker clamps elevated up and the underlying rectus muscles were dissected off sharply.  The peritoneum was . The peritoneal incision was extended superiorly and inferiorly with good visualization of the bladder. The bladder blade was inserted and palpation was done to assess the fetal position and the location of the uterine vessels. The uterus was very small and narrow and the baby was known to be 500 g and in the breech presentation. The decision was made to proceed with a classical cesarean section. The bladder blade was in place and a bladder flap was not created. The l uterus was incised sharply with the scalpel in a vertical fashion. A 1 cm intramural myoma was encountered. This was in the path of the incision.  The myoma was transected and the uterine incision was continued. Once through the endometrium the incision was extended slightly with the bandage scissors and the amniotic sac was ruptured. And the infant's feet were grasped and pulled through the incision. Body and right than left shoulder then head in flexion was accomplished  without difficulty. Tight nuchal cord 1 was reduced. Given poor respiratory effort the cord was clamped and cut and the infant was handed off to the NICU team for resuscitation.  The placenta was expressed. The uterus was exteriorized. The uterus was cleared of all clots and debris. With Allis clamps placed on the 2 halves of the fibroid and with Bovie cautery the fibroid was removed. The uterine incision was repaired with 0 Vicryl in a running locked fashion.  A second layer of the same suture was used in an imbricating fashion. 5 additional figure-of-eight sutures were needed for hemostasis. A 4-0 Vicryl was then used on the serosal layer for a baseball stitch. Excellent hemostasis was noted. The tubes and ovaries were evaluated and found to be normal. The uterus was then returned to the abdomen, the gutters were cleared of all clots and debris. The uterine incision was reinspected and found to be hemostatic. The peritoneum was grasped and closed with 2-0 Vicryl in a running fashion. The cut muscle edges and the underside of the fascia were inspected and found to be hemostatic. The fascia was closed with 0 Vicryl in a single layer. The subcutaneous tissue was irrigated. Scarpa's layer was closed with a 2-0 plain gut suture. The skin was closed with a 4-0 Monocryl in a single layer. The patient tolerated the procedure well. Sponge lap and needle counts were correct x3 and patient was taken to the recovery room in a stable condition.  Castella Lerner A. 12/28/2016 12:28 AM

## 2016-12-28 NOTE — Brief Op Note (Signed)
12/25/2016 - 12/28/2016  12:26 AM  PATIENT:  Angelica [name]  31 y.o. female  PRE-OPERATIVE DIAGNOSIS:  CESAREAN SECTION for worsening severe preeclampsia remote from delivery with an unfavorable cervix  POST-OPERATIVE DIAGNOSIS:  CESAREAN SECTION severe pre- eclampsia  for worsening severe preeclampsia remote from delivery with an unfavorable cervix   PROCEDURE:  Procedure(s): CESAREAN SECTION (N/A)  Classical cesarean section  SURGEON:  Surgeon(s) and Role:    * Aloha Gell, MD - Primary  PHYSICIAN ASSISTANT:   ASSISTANTS: Derrell Lolling, CNM  ANESTHESIA:   spinal  EBL:  851 mL   BLOOD ADMINISTERED:none  DRAINS: Urinary Catheter (Foley)   LOCAL MEDICATIONS USED:  NONE  SPECIMEN:  Source of Specimen:  Placenta  DISPOSITION OF SPECIMEN:  Labor and delivery  COUNTS:  YES  TOURNIQUET:  * No tourniquets in log *  DICTATION: .Note written in EPIC  PLAN OF CARE: Admit to inpatient   PATIENT DISPOSITION:  PACU - hemodynamically stable.   Delay start of Pharmacological VTE agent (>24hrs) due to surgical blood loss or risk of bleeding: yes

## 2016-12-28 NOTE — Anesthesia Postprocedure Evaluation (Signed)
Anesthesia Post Note  Patient: Angelica [name] [facility]  Procedure(s) Performed: CESAREAN SECTION (N/A )     Patient location during evaluation: [facility] Anesthesia Type: Spinal Level of consciousness: oriented and awake and alert Pain management: pain level controlled Vital Signs Assessment: post-procedure vital signs reviewed and stable Respiratory status: spontaneous breathing, respiratory function stable and patient connected to nasal cannula oxygen Cardiovascular status: blood pressure returned to baseline and stable Postop Assessment: no headache, no backache, no apparent nausea or vomiting and patient able to bend at knees Anesthetic complications: no    Last Vitals:  Vitals:   12/28/16 0530 12/28/16 0633  BP: 125/74 132/72  Pulse: (!) 59 68  Resp: 18 18  Temp:    SpO2: 94% 95%    Last Pain:  Vitals:   12/28/16 0633  TempSrc:   PainSc: 0-No pain   Pain Goal:                 Rayvon Char

## 2016-12-28 NOTE — Transfer of Care (Signed)
Immediate Anesthesia Transfer of Care Note  Patient: Angelica [name] [facility]  Procedure(s) Performed: CESAREAN SECTION (N/A )  Patient Location: PACU  Anesthesia Type:Spinal  Level of Consciousness: awake, alert  and oriented  Airway & Oxygen Therapy: Patient Spontanous Breathing  Post-op Assessment: Report given to RN and Post -op Vital signs reviewed and stable  Post vital signs: Reviewed and stable  Last Vitals:  Vitals:   12/27/16 1950 12/27/16 2145  BP: 140/90 (!) 146/96  Pulse: 75 82  Resp: 17 17  Temp: 36.8 C   SpO2: 98% 98%    Last Pain:  Vitals:   12/27/16 2000  TempSrc:   PainSc: 2          Complications: No apparent anesthesia complications

## 2016-12-29 LAB — COMPREHENSIVE METABOLIC PANEL
ALT: 58 U/L — ABNORMAL HIGH (ref 14–54)
AST: 58 U/L — AB (ref 15–41)
Albumin: 2.5 g/dL — ABNORMAL LOW (ref 3.5–5.0)
Alkaline Phosphatase: 65 U/L (ref 38–126)
Anion gap: 4 — ABNORMAL LOW (ref 5–15)
BILIRUBIN TOTAL: 0.4 mg/dL (ref 0.3–1.2)
BUN: 21 mg/dL — AB (ref 6–20)
CO2: 25 mmol/L (ref 22–32)
CREATININE: 1.3 mg/dL — AB (ref 0.44–1.00)
Calcium: 6.7 mg/dL — ABNORMAL LOW (ref 8.9–10.3)
Chloride: 104 mmol/L (ref 101–111)
GFR, EST NON AFRICAN AMERICAN: 54 mL/min — AB (ref >60)
Glucose, Bld: 98 mg/dL (ref 65–99)
POTASSIUM: 4.4 mmol/L (ref 3.5–5.1)
Sodium: 133 mmol/L — ABNORMAL LOW (ref 135–145)
TOTAL PROTEIN: 5.1 g/dL — AB (ref 6.5–8.1)

## 2016-12-29 LAB — CBC WITH DIFFERENTIAL/PLATELET
BASOS ABS: 0 10*3/uL (ref 0.0–0.1)
Basophils Relative: 0 %
EOS PCT: 0 %
Eosinophils Absolute: 0 10*3/uL (ref 0.0–0.7)
HEMATOCRIT: 28.6 % — AB (ref 36.0–46.0)
Hemoglobin: 9.6 g/dL — ABNORMAL LOW (ref 12.0–15.0)
LYMPHS ABS: 2.2 10*3/uL (ref 0.7–4.0)
LYMPHS PCT: 19 %
MCH: 29.9 pg (ref 26.0–34.0)
MCHC: 33.6 g/dL (ref 30.0–36.0)
MCV: 89.1 fL (ref 78.0–100.0)
MONO ABS: 0.7 10*3/uL (ref 0.1–1.0)
MONOS PCT: 6 %
NEUTROS ABS: 8.6 10*3/uL — AB (ref 1.7–7.7)
Neutrophils Relative %: 75 %
PLATELETS: 136 10*3/uL — AB (ref 150–400)
RBC: 3.21 MIL/uL — ABNORMAL LOW (ref 3.87–5.11)
RDW: 13.4 % (ref 11.5–15.5)
WBC: 11.6 10*3/uL — ABNORMAL HIGH (ref 4.0–10.5)

## 2016-12-29 LAB — RH IG WORKUP (INCLUDES ABO/RH)
ABO/RH(D): O NEG
Fetal Screen: NEGATIVE
Gestational Age(Wks): 25
Unit division: 0

## 2016-12-29 NOTE — Progress Notes (Signed)
Pt oob to go to br, stood up c/o abd pain unable to walk to br pt back to bed.  Pt unable to walk to BR due to pain.  Bedside commode provided pt voided peri care done and back to bed.

## 2016-12-29 NOTE — Consult Note (Signed)
Per RN, parents declined meeting with a lactation consultant to discuss how to manage breasts after milk comes to volume.  Elinor Dodge, RN, IBCLC

## 2016-12-29 NOTE — Progress Notes (Signed)
Pt grieving, no c/o denies any sob/cp, n/v; tol po; no h/a; no flatus; pain in abd when gets up to void; has been declining pain meds Reviewed good pain management to allow for post-op recovery and pt understands; normal lochia  Declined pastoral services, has family coming today  Patient Vitals for the past 24 hrs:  BP Temp Temp src Pulse Resp SpO2  12/29/16 0824 (!) 146/93 98.1 F (36.7 C) Oral 61 18 100 %  12/29/16 0601 - - - - 18 -  12/29/16 0500 - - - - 18 -  12/29/16 0400 127/82 98.4 F (36.9 C) Oral 68 18 98 %  12/29/16 0259 - - - - 18 -  12/29/16 0202 - - - - 18 -  12/29/16 0100 - - - - 18 -  12/28/16 2351 118/81 98 F (36.7 C) Oral 73 18 97 %  12/28/16 2300 - - - - 18 -  12/28/16 2200 - - - - 18 -  12/28/16 2100 - - - - 18 -  12/28/16 2000 (!) 145/95 98.4 F (36.9 C) Oral 69 18 98 %  12/28/16 1900 - - - - 18 -  12/28/16 1800 - - - - 16 -  12/28/16 1700 - - - - 16 -  12/28/16 1600 - - - - 18 -  12/28/16 1500 - - - - 16 -  12/28/16 1400 - - - - 18 -  12/28/16 1300 - - - - 16 -  12/28/16 1242 112/67 98.6 F (37 C) Oral (!) 59 16 99 %  12/28/16 1200 - - - - 16 -  12/28/16 1100 - - - - 16 -  12/28/16 1000 - - - - 16 -    Intake/Output Summary (Last 24 hours) at 12/29/16 0928 Last data filed at 12/29/16 2951  Gross per 24 hour  Intake          3780.83 ml  Output             3550 ml  Net           230.83 ml   UOP: 923ml in last 3 hr, prior 3 hrs: 712ml; prior 6 hr 724ml, 422ml in prior 8 hr (collected at 2000)  A&Ox3 rrr ctab Abd: decreased BS, soft, mild distension; nt; fundus firm and below umb dressing: small amt old blood, intact LE: +scds, trace edema, nt bilat   CBC Latest Ref Rng & Units 12/29/2016 12/28/2016 12/27/2016  WBC 4.0 - 10.5 K/uL 11.6(H) 15.6(H) 9.6  Hemoglobin 12.0 - 15.0 g/dL 9.6(L) 11.1(L) 11.1(L)  Hematocrit 36.0 - 46.0 % 28.6(L) 32.0(L) 31.2(L)  Platelets 150 - 400 K/uL 136(L) 161 134(L)   CMP Latest Ref Rng & Units 12/29/2016  12/28/2016 12/27/2016  Glucose 65 - 99 mg/dL 98 114(H) 107(H)  BUN 6 - 20 mg/dL 21(H) 20 22(H)  Creatinine 0.44 - 1.00 mg/dL 1.30(H) 1.15(H) 1.26(H)  Sodium 135 - 145 mmol/L 133(L) 133(L) 134(L)  Potassium 3.5 - 5.1 mmol/L 4.4 4.7 3.9  Chloride 101 - 111 mmol/L 104 107 108  CO2 22 - 32 mmol/L 25 20(L) 18(L)  Calcium 8.9 - 10.3 mg/dL 6.7(L) 7.2(L) 7.2(L)  Total Protein 6.5 - 8.1 g/dL 5.1(L) 5.0(L) 5.1(L)  Total Bilirubin 0.3 - 1.2 mg/dL 0.4 0.4 0.4  Alkaline Phos 38 - 126 U/L 65 67 74  AST 15 - 41 U/L 58(H) 41 38  ALT 14 - 54 U/L 58(H) 44 44   A/P: pod 1s/p classical c/s 1. CHTN  with HELLP - contin labetalol at 200mg  bid; bps stable; lfts have increased this am, will follow closely for trend; plan repeat labs in 12 hrs; no RUQ pain; d/c magnesium sulfate, s/p >24 hrs of treatment (30hrs) 2. Elevated BUN,creatinine - acute process; had trended down 24 hrs ago but now back to 1.3 - repeat labs today; pt has had significant increase in UOP in last 8-12 hours; will contin to monitor strict I/O's and repeat labs in 12 hrs; follow closely 3. Mild anemia - acute - asymptomatic; follow closely, plan iron q day pp 4. Neonatal death - pt grieving appropriately, good support; declines pastoral services 5. Severe anxiety -  Contin current management; ativan q 6 prn, zoloft 6. Post -op: increase ambulation, work on better pain control  I have reviewed the above findings with the patient and her husband.  Findings c/w complications of her diagnosis of CHTN with HELLP - improved UOP this am and anticipate continued improvement and following labs closely; questioning when specialists are consulted - understand that these findings can occur with disease process and would consider consult if continues to worsen (renal fcn, lfts) but at this time need to monitor for trend; bps stable and reassured with this; discussed that can take time to see resolution of disease process but will follow closely

## 2016-12-30 DIAGNOSIS — O1420 HELLP syndrome (HELLP), unspecified trimester: Secondary | ICD-10-CM

## 2016-12-30 DIAGNOSIS — D62 Acute posthemorrhagic anemia: Secondary | ICD-10-CM

## 2016-12-30 LAB — COMPREHENSIVE METABOLIC PANEL
ALBUMIN: 2.5 g/dL — AB (ref 3.5–5.0)
ALK PHOS: 61 U/L (ref 38–126)
ALT: 47 U/L (ref 14–54)
AST: 39 U/L (ref 15–41)
Anion gap: 4 — ABNORMAL LOW (ref 5–15)
BILIRUBIN TOTAL: 0.3 mg/dL (ref 0.3–1.2)
BUN: 15 mg/dL (ref 6–20)
CALCIUM: 7 mg/dL — AB (ref 8.9–10.3)
CO2: 25 mmol/L (ref 22–32)
Chloride: 109 mmol/L (ref 101–111)
Creatinine, Ser: 1.21 mg/dL — ABNORMAL HIGH (ref 0.44–1.00)
GFR calc Af Amer: >60 mL/min (ref >60)
GFR calc non Af Amer: 59 mL/min — ABNORMAL LOW (ref >60)
GLUCOSE: 88 mg/dL (ref 65–99)
Potassium: 4.7 mmol/L (ref 3.5–5.1)
SODIUM: 138 mmol/L (ref 135–145)
TOTAL PROTEIN: 5 g/dL — AB (ref 6.5–8.1)

## 2016-12-30 LAB — CBC
HCT: 26.3 % — ABNORMAL LOW (ref 36.0–46.0)
Hemoglobin: 9.5 g/dL — ABNORMAL LOW (ref 12.0–15.0)
MCH: 32.1 pg (ref 26.0–34.0)
MCHC: 36.1 g/dL — AB (ref 30.0–36.0)
MCV: 88.9 fL (ref 78.0–100.0)
PLATELETS: 112 10*3/uL — AB (ref 150–400)
RBC: 2.96 MIL/uL — ABNORMAL LOW (ref 3.87–5.11)
RDW: 13.6 % (ref 11.5–15.5)
WBC: 8.4 10*3/uL (ref 4.0–10.5)

## 2016-12-30 NOTE — Progress Notes (Signed)
I visited with Angelica [name] and Angelica [name] this morning to offer support and resources upon the death of their daughter, Sabrin Dunlevy.  [name] was very tearful and shared her feelings of guilt because she wasn't able to keep her safe and that was her only job.  I made space for [name] to share about [name]'s sudden birth after a normal pregnancy and I normalized losses during pregnancy.  [name] seemed comforted but surprised to hear that sometimes these things happen and that she had no responsibility or control over the situation.  I reminded her that she provided her baby a life of love and warmth and comfort.  I normalized the grief process and reminded [name] that her first work is to to eat and sleep and grieve and that she can't rush the process to the end.  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  [name] is appropriately tearful and grateful for support.  Please page as further needs arise.  Donald Prose. Elyn Peers, M.Div. [facility] Chaplain Pager [phone] Office [phone]     12/30/16 1227  Clinical Encounter Type  Visited With Patient and family together  Visit Type Spiritual support;Death  Referral From Nurse  Spiritual Encounters  Spiritual Needs Emotional;Grief support  Stress Factors  Patient Stress Factors Loss  Family Stress Factors Loss

## 2016-12-30 NOTE — Plan of Care (Signed)
Problem: Coping: Goal: Ability to cope will improve Outcome: Progressing Patient is anxious and tearful at times. Significant other at bedside. They have had family visiting earlier today and expect more family to arrive tomorrow.   Problem: Life Cycle: Goal: Risk for postpartum hemorrhage will decrease Outcome: Progressing Patient has minimal lochia and has agreed to notify staff if bleeding increases. Goal: Chance of risk for complications during the postpartum period will decrease Outcome: Progressing Patient and significant other reminded to notify staff of problems or concerns.

## 2016-12-30 NOTE — Progress Notes (Addendum)
POSTOPERATIVE DAY # 3 S/P Primary Classical cesarean section for severe pre-eclampsia with HELLP   S:         Reports feeling better physically, but states emotionally she is still having a difficult time. "Feels alone, feels guilty like I should have done more to protect her"  Denies HA, visual changes, RUQ/epigastric pain              Tolerating po intake / no nausea / no vomiting / + flatus / no BM  Denies dizziness, SOB, or CP             Bleeding is moderate             Pain controlled with Motrin and Percocet             Up ad lib / ambulatory/ voiding QS   O:  VS: BP 127/82 (BP Location: Right Arm)   Pulse 66   Temp 98.7 F (37.1 C)   Resp 16   Ht _0  (1.651 m)   Wt 82.1 kg (181 lb 0.8 oz)   SpO2 99%   BMI 30.13 kg/m    LABS:               Recent Labs  12/29/16 0608 12/30/16 0517  WBC 11.6* 8.4  HGB 9.6* 9.5*  PLT 136* 112*               Bloodtype: --/--/O NEG (10/28 0508)  Rubella:     Non-immune                                          I&O: Intake/Output      10/29 0701 - 10/30 0700 10/30 0701 - 10/31 0700   P.O. 1700 240   Total Intake(mL/kg) 1700 (20.7) 240 (2.9)   Urine (mL/kg/hr) 5740 (2.9) 550 (2.9)   Total Output 5740 550   Net -4040 -310                     Physical Exam:             Alert and Oriented X3  Lungs: Clear and unlabored  Heart: regular rate and rhythm / no murmurs  Abdomen: soft, non-tender, non-distended, active bowel sounds in all quadrants             Fundus: firm, non-tender, U-3             Dressing: honeycomb dsg with steri-strips c/d/i              Incision:  approximated with sutures / no erythema / no ecchymosis / no drainage  Perineum: intact   Lochia: appropriate, no clots  Extremities: trace BLE edema, no calf pain or tenderness, SCDs on  +4 DTR on left leg, +3 DTRs on right leg, no clonus bilaterally   A:        POD # 3 S/P Primary Classical cesarean section for severe pre-eclampsia with HELLP  Chronic  Hypertension   - BPs stable on Labetalol 273m TID   - Creatinine and BUN improving    - UO excellent    - Persistent thrombocytopenia    - Repeat CBC and CMP in AM            ABL Anemia - stable, asymptomatic   - Prenatal with iron supplement   RH Negative-  Baby RH Positive   - s/p Rhogam on 10/28  Neonatal demise    - s/p Chaplain services- heart strings information given   - Offered CSW and pt. Declined at this time   - Appropriately grieving    - Comfort and support provided  Severe anxiety   - Stable on Ativan PRN, Zoloft 280m daily, Buspar 188mTID   - Pt. States she wants outpatient counseling and does not want to go  back on Lithium    - At high risk for PPD - will need close f/u   Rubella Non-immune   - Offer MMR vaccine prior to discharge  Routine postoperative care              May shower today   D/C IV today if BPs continue to be stable   Consult for plan of care: Dr. FoJanae SauceMSN, CNM [facility]

## 2016-12-31 ENCOUNTER — Encounter (HOSPITAL_COMMUNITY): Payer: Self-pay

## 2016-12-31 LAB — COMPREHENSIVE METABOLIC PANEL
ALBUMIN: 2.4 g/dL — AB (ref 3.5–5.0)
ALT: 44 U/L (ref 14–54)
AST: 38 U/L (ref 15–41)
Alkaline Phosphatase: 53 U/L (ref 38–126)
Anion gap: 5 (ref 5–15)
BUN: 9 mg/dL (ref 6–20)
CHLORIDE: 110 mmol/L (ref 101–111)
CO2: 24 mmol/L (ref 22–32)
CREATININE: 1.04 mg/dL — AB (ref 0.44–1.00)
Calcium: 8.2 mg/dL — ABNORMAL LOW (ref 8.9–10.3)
GFR calc Af Amer: >60 mL/min (ref >60)
GLUCOSE: 84 mg/dL (ref 65–99)
Potassium: 4.4 mmol/L (ref 3.5–5.1)
Sodium: 139 mmol/L (ref 135–145)
Total Bilirubin: 0.3 mg/dL (ref 0.3–1.2)
Total Protein: 4.8 g/dL — ABNORMAL LOW (ref 6.5–8.1)

## 2016-12-31 LAB — CBC
HEMATOCRIT: 26.5 % — AB (ref 36.0–46.0)
Hemoglobin: 9.3 g/dL — ABNORMAL LOW (ref 12.0–15.0)
MCH: 31.3 pg (ref 26.0–34.0)
MCHC: 35.1 g/dL (ref 30.0–36.0)
MCV: 89.2 fL (ref 78.0–100.0)
PLATELETS: 130 10*3/uL — AB (ref 150–400)
RBC: 2.97 MIL/uL — AB (ref 3.87–5.11)
RDW: 13.6 % (ref 11.5–15.5)
WBC: 7.4 10*3/uL (ref 4.0–10.5)

## 2016-12-31 MED ORDER — IBUPROFEN 600 MG PO TABS: 600.0000 mg | ORAL_TABLET | Freq: Four times a day (QID) | ORAL | 0 refills | Status: DC

## 2016-12-31 MED ORDER — FERROUS SULFATE 325 (65 FE) MG PO TABS: 325.0000 mg | ORAL_TABLET | Freq: Every day | ORAL | 3 refills | Status: DC

## 2016-12-31 MED ORDER — OXYCODONE-ACETAMINOPHEN 5-325 MG PO TABS: 2.0000 | ORAL_TABLET | ORAL | 0 refills | Status: DC | PRN

## 2016-12-31 MED ORDER — LORAZEPAM 0.5 MG PO TABS: 0.5000 mg | ORAL_TABLET | Freq: Four times a day (QID) | ORAL | 0 refills | Status: DC | PRN

## 2016-12-31 MED ORDER — LABETALOL HCL 200 MG PO TABS: 200.0000 mg | ORAL_TABLET | Freq: Three times a day (TID) | ORAL | 3 refills | Status: DC

## 2016-12-31 NOTE — Progress Notes (Signed)
I offered grief support to Angelica [name] family on 10/30 over a period of about an hour.  I spent time with her father (who traveled from [place]), her sister (traveled from [place]) and her mother who lives here locally.  They were processing their loss and feelings as well as how to process Angelica [name] and [place], while also navigating difficult family dynamics.  I spoke over the phone to FOB's mother and gave her space to process her own feelings.    Today, I met Angelica [name] and Angelica [name] and offered them support as they prepared to go home.  I let them all know of our ongoing availability to support each of them.  Angelica [name], [employer] Pager, [phone] 4:06 PM    12/31/16 1600  Clinical Encounter Type  Visited With Family;Patient and family together  Visit Type Spiritual support  Referral From Chaplain;Nurse  Consult/Referral To (Friend of the family contacted me. )  Spiritual Encounters  Spiritual Needs Emotional;Grief support  Stress Factors  Patient Stress Factors Loss  Family Stress Factors Family relationships;Loss

## 2016-12-31 NOTE — Progress Notes (Signed)
POSTOPERATIVE DAY # 4 Primary Classical cesarean section for severe pre-eclampsia with HELLP   S:         Reports feeling okay, better physically and emotionally - appropriately grieving   Denies HA, visual changes, RUQ/epigastric pain              Tolerating po intake / no nausea / no vomiting / + flatus / + BM  Denies dizziness, SOB, or CP             Bleeding is light             Pain controlled with Percocet and Motrin             Up ad lib / ambulatory/ voiding QS   O:  VS: BP (!) 139/92 (BP Location: Right Arm)   Pulse 71   Temp 98.6 F (37 C) (Oral)   Resp 16   Ht _0  (1.651 m)   Wt 82.1 kg (181 lb 0.8 oz)   SpO2 100%   BMI 30.13 kg/m    LABS:               Recent Labs  12/30/16 0517 12/31/16 0611  WBC 8.4 7.4  HGB 9.5* 9.3*  PLT 112* 130*               Bloodtype: --/--/O NEG (10/28 0508)  Rubella:     Non-immune                                          I&O: Intake/Output      10/30 0701 - 10/31 0700 10/31 0701 - 11/01 0700   P.O. 1320    Total Intake(mL/kg) 1320 (16.1)    Urine (mL/kg/hr) 3250 (1.6)    Total Output 3250     Net -1930                       Physical Exam:             Alert and Oriented X3  Lungs: Clear and unlabored  Heart: regular rate and rhythm / no murmurs  Abdomen: soft, non-tender, non-distended, active bowel sounds in all quadrants             Fundus: firm, non-tender, U-4             Dressing: honeycomb dsg with steri-strips c/d/i              Incision:  approximated with sutures / no erythema / no ecchymosis / no drainage  Perineum: intact  Lochia: appropriate, no clots   Extremities: +1 BLE edema, no calf pain or tenderness, +3 DTRs, no clonus bilaterally   A:        POD # 4 S/P Primary Classical cesarean section for severe pre-eclampsia with HELLP             Chronic Hypertension                         - BPs stable on Labetalol 231m TID - reviewed with Dr. FPamala Hurry                        - Creatinine and BUN  improving                          -  UO excellent                          - Thrombocytopenia improving   - LFTs normal             ABL Anemia - stable, asymptomatic                         - Oral FE prescribed               RH Negative- Baby RH Positive                         - s/p Rhogam on 10/28             Neonatal demise                          - s/p Chaplain services- heart strings information given                         - Appropriately grieving                          - Comfort and support provided             Severe anxiety                         - Stable on Ativan PRN, Zoloft 264m daily, Buspar 138mTID                         - Pt. States she will call her psychiatrist for close interval f/u                         - At high risk for PPD - will need close f/u              Rubella Non-immune                         - Offer MMR vaccine prior to discharge             Routine postoperative care              D/C IV today   D/C home today  WOB discharge book and instructions given  Pre-eclampsia precautions reviewed    F/u with Dr. FoPamala Hurryn Monday for repeat BP check and labs  Consult for plan of care: Dr. FoJanae SauceMSN, CNM [facility]

## 2016-12-31 NOTE — Progress Notes (Signed)
Patient taken out in wheelchair. Emotional support given as needed. Discharge instructions given by and discussed with Ailene Ravel, CNM, Vonzella Nipple, RN & Stark Jock, RN. Chaplain also talked to patient before departure.

## 2016-12-31 NOTE — Progress Notes (Signed)
Breast care reviewed post perinatal loss and pamphlet given to patient.

## 2016-12-31 NOTE — Discharge Summary (Signed)
OB Discharge Summary     Patient Name: Angelica [name] [facility] DOB: 1985/04/02 MRN: [id number]  Date of admission: 12/25/2016 Delivering MD: Aloha Gell   Date of discharge: 12/31/2016  Admitting diagnosis: 24.5wks High BP Intrauterine pregnancy: [redacted]w[redacted]d     Secondary diagnosis:  Principal Problem:   Cesarean delivery delivered - severe PEC, 25 wks, classic incision 10/27 Active Problems:   Chronic hypertension with exacerbation during pregnancy in second trimester   Severe preeclampsia   Postpartum care following cesarean delivery   HELLP syndrome   Acute blood loss anemia  Additional problems: Neonatal demise     Discharge diagnosis:                Severe PEC                                                                            Post partum procedures: none  Augmentation:  none  Complications: neonatal demise  Hospital course:  Pt admitted with elevated bp's, severe anxiety and lab abnormalities. Pt needed multiple doses IV meds to control bps (labetalol 20,40,80 mg then IV hydralizine). Her oral meds were increased to labetalol 400mg  q 8hrs. No additional antihypertensives were needed. Pt received BMZ x 2 and 24 hr Mag Sulfate. AST/ALT were slightly elevated in 40's, and plt low in 130s. These abnormal values remained relatively stable over the first 48rhs. 24 hr protein was 3+ grams, urine output was low at 1L over 24 hrs and Creatinine rose consistently during her 48 hrs antepartum. Pt did have bouts of extreme anxiety, manifestign as N/V and was controlled with Zofran, Phenergan and Ativan. Pt also remained on her Buspar and Sertraline. Pt has consultation with MFM and NICU. Fetal u/s showed SGA baby with elevated dopplers.  Once BMZ complete, Cr rose to 1.26 and decision for PCS. Classical c/s for SGA breech baby. Prolonged resusitation in OR for baby, who was then taken to NICU. Baby then decompensated about 12 hrs of life and died in parents presence. PP pt's Cr  initially improved then rose on PPD 2 then back down to just slight elevated on day of d/c. Plts dropped some PP then rose and LFTS steadily declines back into a normal range. Pt resolved her hyperreflexia and edema, had marked improvement in urine output. Pt was healing as expected, had much family support and was given information on Heartstrings. Pt was planning to seek out a grief counselor.    Physical exam  Vitals:   12/30/16 2102 12/30/16 2316 12/31/16 0403 12/31/16 0746  BP: (!) 150/88 (!) 148/88 140/90 (!) 139/92  Pulse: 68 72 66 71  Resp: 20 18 16 16   Temp:  98.5 F (36.9 C) 99 F (37.2 C) 98.6 F (37 C)  TempSrc:  Oral Oral Oral  SpO2:  100% 98% 100%  Weight:      Height:       General: alert and cooperative, appropriately tearful at times   Lungs: Clear and unlabored Heart: regular rate and rhythm / no murmurs Abdomen: soft, non-tender, non-distended, active bowel sounds  Lochia: appropriate, scant Uterine Fundus: firm, 4cm below umbilicus  Incision: Healing well with no significant drainage, No significant erythema, Dressing is  clean, dry, and intact DVT Evaluation: No evidence of DVT seen on physical exam. Lower extremities: +3 DTRs bilaterally, no clonus, +1 lower extremity edema  Labs: Lab Results  Component Value Date   WBC 7.4 12/31/2016   HGB 9.3 (L) 12/31/2016   HCT 26.5 (L) 12/31/2016   MCV 89.2 12/31/2016   PLT 130 (L) 12/31/2016   CMP Latest Ref Rng & Units 12/31/2016  Glucose 65 - 99 mg/dL 84  BUN 6 - 20 mg/dL 9  Creatinine 0.44 - 1.00 mg/dL 1.04(H)  Sodium 135 - 145 mmol/L 139  Potassium 3.5 - 5.1 mmol/L 4.4  Chloride 101 - 111 mmol/L 110  CO2 22 - 32 mmol/L 24  Calcium 8.9 - 10.3 mg/dL 8.2(L)  Total Protein 6.5 - 8.1 g/dL 4.8(L)  Total Bilirubin 0.3 - 1.2 mg/dL 0.3  Alkaline Phos 38 - 126 U/L 53  AST 15 - 41 U/L 38  ALT 14 - 54 U/L 44    Discharge instruction: per After Visit Summary and WOB  After visit meds:  Allergies as of 12/31/2016       Reactions   Latex Anaphylaxis, Hives   Avocado    Sensitivity, not sure   Banana Other (See Comments)   Sensitivity, not sure   Kiwi Extract    Sensitivity, not sure      Medication List    STOP taking these medications   aspirin EC 81 MG tablet     TAKE these medications   busPIRone 15 MG tablet Commonly known as:  BUSPAR Take 15 mg by mouth 3 (three) times daily.   DOCOSAHEXAENOIC ACID PO Take by mouth.   EPINEPHrine 0.3 mg/0.3 mL Soaj injection Commonly known as:  EPI-PEN INJECT 0.3MLS INTO THE MUSCLE ONCE   ferrous sulfate 325 (65 FE) MG tablet Take 1 tablet (325 mg total) by mouth daily.   folic acid 1 MG tablet Commonly known as:  FOLVITE Take 1 tablet (1 mg total) by mouth daily.   ibuprofen 600 MG tablet Commonly known as:  ADVIL,MOTRIN Take 1 tablet (600 mg total) by mouth every 6 (six) hours.   labetalol 200 MG tablet Commonly known as:  NORMODYNE Take 1 tablet (200 mg total) by mouth every 8 (eight) hours. What changed:  when to take this   loratadine 10 MG tablet Commonly known as:  CLARITIN Take 10 mg by mouth daily as needed for allergies or rhinitis.   LORazepam 0.5 MG tablet Commonly known as:  ATIVAN Take 1 tablet (0.5 mg total) by mouth every 6 (six) hours as needed for anxiety. What changed:  when to take this  reasons to take this   oxyCODONE-acetaminophen 5-325 MG tablet Commonly known as:  PERCOCET/ROXICET Take 2 tablets by mouth every 4 (four) hours as needed (pain scale > 7).   prenatal vitamin w/FE, FA 27-1 MG Tabs tablet Take 1 tablet by mouth daily at 12 noon.   sertraline 100 MG tablet Commonly known as:  ZOLOFT Take 2 tablets (200 mg total) by mouth daily.       Diet: low salt diet  Activity: Advance as tolerated. Pelvic rest for 6 weeks.   Outpatient follow QQ:[id number] 5, 2018 for repeat BP check and labs  Postpartum contraception:  Newborn Data: Live born female  Birth Weight: 1 lb 6.6 oz (640  g) APGAR: 5, 7  Newborn Delivery   Birth date/time:  12/27/2016 23:25:00 Delivery type:  C-Section, Classical  C-section categorization:  Primary    Disposition:morgue  12/31/2016 Darliss Cheney, CNM

## 2017-01-07 ENCOUNTER — Encounter (HOSPITAL_COMMUNITY): Payer: Self-pay | Admitting: *Deleted

## 2017-01-07 ENCOUNTER — Inpatient Hospital Stay (HOSPITAL_COMMUNITY)
Admission: AD | Admit: 2017-01-07 | Discharge: 2017-01-07 | Disposition: A | Discharge status: Home / Self Care | Payer: 59 | Source: Ambulatory Visit | Attending: Obstetrics | Admitting: Obstetrics

## 2017-01-07 DIAGNOSIS — F329 Major depressive disorder, single episode, unspecified: Secondary | ICD-10-CM | POA: Insufficient documentation

## 2017-01-07 DIAGNOSIS — O1495 Unspecified pre-eclampsia, complicating the puerperium: Secondary | ICD-10-CM

## 2017-01-07 DIAGNOSIS — O162 Unspecified maternal hypertension, second trimester: Secondary | ICD-10-CM | POA: Diagnosis not present

## 2017-01-07 DIAGNOSIS — H538 Other visual disturbances: Secondary | ICD-10-CM | POA: Diagnosis not present

## 2017-01-07 DIAGNOSIS — O99342 Other mental disorders complicating pregnancy, second trimester: Secondary | ICD-10-CM | POA: Insufficient documentation

## 2017-01-07 DIAGNOSIS — F419 Anxiety disorder, unspecified: Secondary | ICD-10-CM | POA: Diagnosis not present

## 2017-01-07 DIAGNOSIS — O1410 Severe pre-eclampsia, unspecified trimester: Secondary | ICD-10-CM | POA: Diagnosis not present

## 2017-01-07 DIAGNOSIS — Z3A00 Weeks of gestation of pregnancy not specified: Secondary | ICD-10-CM | POA: Insufficient documentation

## 2017-01-07 HISTORY — DX: Gestational (pregnancy-induced) hypertension without significant proteinuria, unspecified trimester: O13.9

## 2017-01-07 LAB — PROTEIN / CREATININE RATIO, URINE
Creatinine, Urine: 34 mg/dL
PROTEIN CREATININE RATIO: 0.21 mg/mg{creat} — AB (ref 0.00–0.15)
TOTAL PROTEIN, URINE: 7 mg/dL

## 2017-01-07 LAB — CBC
HEMATOCRIT: 37.4 % (ref 36.0–46.0)
Hemoglobin: 12.7 g/dL (ref 12.0–15.0)
MCH: 30 pg (ref 26.0–34.0)
MCHC: 34 g/dL (ref 30.0–36.0)
MCV: 88.4 fL (ref 78.0–100.0)
PLATELETS: 242 10*3/uL (ref 150–400)
RBC: 4.23 MIL/uL (ref 3.87–5.11)
RDW: 13.7 % (ref 11.5–15.5)
WBC: 8.7 10*3/uL (ref 4.0–10.5)

## 2017-01-07 LAB — COMPREHENSIVE METABOLIC PANEL
ALBUMIN: 3.9 g/dL (ref 3.5–5.0)
ALT: 37 U/L (ref 14–54)
ANION GAP: 9 (ref 5–15)
AST: 25 U/L (ref 15–41)
Alkaline Phosphatase: 64 U/L (ref 38–126)
BUN: 15 mg/dL (ref 6–20)
CHLORIDE: 107 mmol/L (ref 101–111)
CO2: 21 mmol/L — AB (ref 22–32)
Calcium: 9.3 mg/dL (ref 8.9–10.3)
Creatinine, Ser: 1.26 mg/dL — ABNORMAL HIGH (ref 0.44–1.00)
GFR calc Af Amer: >60 mL/min (ref >60)
GFR calc non Af Amer: 56 mL/min — ABNORMAL LOW (ref >60)
GLUCOSE: 95 mg/dL (ref 65–99)
POTASSIUM: 4.3 mmol/L (ref 3.5–5.1)
SODIUM: 137 mmol/L (ref 135–145)
Total Bilirubin: 0.5 mg/dL (ref 0.3–1.2)
Total Protein: 6.7 g/dL (ref 6.5–8.1)

## 2017-01-07 MED ORDER — LABETALOL HCL 200 MG PO TABS
400.0000 mg | ORAL_TABLET | Freq: Three times a day (TID) | ORAL | 0 refills | Status: DC
Start: 2017-01-07 — End: 2017-01-26

## 2017-01-07 NOTE — MAU Note (Signed)
Pt reports her b/p at home was 140/106, post partum 10/27 IUFD, blurred vision at home today

## 2017-01-07 NOTE — H&P (Signed)
Chief complaint: Hypertension  History present illness: 31 year old G2 P0110 1-1/2 weeks from her primary classical cesarean section at 25 weeks for worsening preeclampsia with eventual neonatal demise. Patient had severe preeclampsia with renal involvement and bump in creatinine to 1.3. She received magnesium upon admission and further 24 hours post delivery. Over her 3 day postpartum course patient had eventual improvement in LFTs and creatinine. She was discharged to home with labetalol 200 mg every 8 hours, medications for depression and anxiety and pain medication. Patient has been off of her pain medication and compliant with her other medications. She was seen yesterday in the office for a follow-up visit and had normal blood pressures. Her labs were done which returned with a creatinine of 1.24. ALP was 40 and other labs were normal. Nurse's call to check on patient today and she was now reporting some fuzziness in her vision. No scotomata, no headache, no right upper quadrant pain no increase in edema. However her blood pressure was 146/104. She was instructed to come to the maternity admissions for further evaluation. Given her history of anxiety she was instructed to take her oral Ativan prior to arrival.   Here patient reports improvement in the blurry vision and no other additional symptoms. Patient does report going to the gym for 20 minutes of moderate walking today. This was the first she has been out since her delivery.  Patient has grief counseling appointment tomorrow  Past Medical History:  Diagnosis Date  . Allergy   . Anxiety   . Chronic hypertension   . Depression   . Pregnancy induced hypertension   . Tracheoesophageal fistula [facility])     Past Surgical History:  Procedure Laterality Date  . DILATION AND CURETTAGE OF UTERUS  01/2013  . ESOPHAGUS SURGERY    . TRACHEAL SURGERY      PE Vitals:   01/07/17 1315 01/07/17 1330 01/07/17 1345 01/07/17 1400  BP: (!) 146/100 (!)  138/101 (!) 142/102 (!) 131/94  Pulse: 66 60 64 67  Resp:      Temp:      TempSrc:      SpO2: 98% 98% 100% 97%   General: Well-appearing with mild anxiety, no distress Cardiovascular: Regular rate and rhythm, no murmur Pulmonary: Clear to auscultation bilaterally Skin: Warm and dry Abdomen: Well-healing Pfannenstiel skin incision, appropriate postsurgical tenderness, no rebound, no guarding, no right upper quadrant pain GU: Deferred Lower extremity: 3+ DTR in right leg, 2+ DTR in left leg, trace edema, nonpitting, no clonus  CBC Latest Ref Rng & Units 01/07/2017 12/31/2016 12/30/2016  WBC 4.0 - 10.5 K/uL 8.7 7.4 8.4  Hemoglobin 12.0 - 15.0 g/dL 12.7 9.3(L) 9.5(L)  Hematocrit 36.0 - 46.0 % 37.4 26.5(L) 26.3(L)  Platelets 150 - 400 K/uL 242 130(L) 112(L)    CMP Latest Ref Rng & Units 01/07/2017 12/31/2016 12/30/2016  Glucose 65 - 99 mg/dL 95 84 88  BUN 6 - 20 mg/dL 15 9 15   Creatinine 0.44 - 1.00 mg/dL 1.26(H) 1.04(H) 1.21(H)  Sodium 135 - 145 mmol/L 137 139 138  Potassium 3.5 - 5.1 mmol/L 4.3 4.4 4.7  Chloride 101 - 111 mmol/L 107 110 109  CO2 22 - 32 mmol/L 21(L) 24 25  Calcium 8.9 - 10.3 mg/dL 9.3 8.2(L) 7.0(L)  Total Protein 6.5 - 8.1 g/dL 6.7 4.8(L) 5.0(L)  Total Bilirubin 0.3 - 1.2 mg/dL 0.5 0.3 0.3  Alkaline Phos 38 - 126 U/L 64 53 61  AST 15 - 41 U/L 25 38 39  ALT 14 - 54 U/L 37 44 47     Assessment and plan: Preeclampsia with postpartum exacerbation given new elevation in creatinine and slight increase in blood pressure. Patient without any neurologic symptoms. Will increase labetalol to 400 mg every 8 hours. Patient husband aware of worrisome symptoms. Will continue hydration and decrease home activity. Patient is been instructed not to go to the gym at this time. Patient will mostly rest. Will follow patient up in the office in 2 days time and will repeat labs at that time. Patient understands increased risk of seizure with postpartum preeclampsia but overall risks are  low and patient is safe for outpatient management at this time. Husband is a EMS first responder and will be with patient at most times. They're both home grieving the loss of their child.   Emberleigh Reily A. 01/07/2017 2:21 PM

## 2017-01-07 NOTE — Discharge Instructions (Signed)
Preeclampsia and Eclampsia °Preeclampsia is a serious condition that develops only during pregnancy. It is also called toxemia of pregnancy. This condition causes high blood pressure along with other symptoms, such as swelling and headaches. These symptoms may develop as the condition gets worse. Preeclampsia may occur at 20 weeks of pregnancy or later. °Diagnosing and treating preeclampsia early is very important. If not treated early, it can cause serious problems for you and your baby. One problem it can lead to is eclampsia, which is a condition that causes muscle jerking or shaking (convulsions or seizures) in the mother. Delivering your baby is the best treatment for preeclampsia or eclampsia. Preeclampsia and eclampsia symptoms usually go away after your baby is born. °What are the causes? °The cause of preeclampsia is not known. °What increases the risk? °The following risk factors make you more likely to develop preeclampsia: °· Being pregnant for the first time. °· Having had preeclampsia during a past pregnancy. °· Having a family history of preeclampsia. °· Having high blood pressure. °· Being pregnant with twins or triplets. °· Being 35 or older. °· Being African-American. °· Having kidney disease or diabetes. °· Having medical conditions such as lupus or blood diseases. °· Being very overweight (obese). ° °What are the signs or symptoms? °The earliest signs of preeclampsia are: °· High blood pressure. °· Increased protein in your urine. Your health care provider will check for this at every visit before you give birth (prenatal visit). ° °Other symptoms that may develop as the condition gets worse include: °· Severe headaches. °· Sudden weight gain. °· Swelling of the hands, face, legs, and feet. °· Nausea and vomiting. °· Vision problems, such as blurred or double vision. °· Numbness in the face, arms, legs, and feet. °· Urinating less than usual. °· Dizziness. °· Slurred speech. °· Abdominal pain,  especially upper abdominal pain. °· Convulsions or seizures. ° °Symptoms generally go away after giving birth. °How is this diagnosed? °There are no screening tests for preeclampsia. Your health care provider will ask you about symptoms and check for signs of preeclampsia during your prenatal visits. You may also have tests that include: °· Urine tests. °· Blood tests. °· Checking your blood pressure. °· Monitoring your baby’s heart rate. °· Ultrasound. ° °How is this treated? °You and your health care provider will determine the treatment approach that is best for you. Treatment may include: °· Having more frequent prenatal exams to check for signs of preeclampsia, if you have an increased risk for preeclampsia. °· Bed rest. °· Reducing how much salt (sodium) you eat. °· Medicine to lower your blood pressure. °· Staying in the hospital, if your condition is severe. There, treatment will focus on controlling your blood pressure and the amount of fluids in your body (fluid retention). °· You may need to take medicine (magnesium sulfate) to prevent seizures. This medicine may be given as an injection or through an IV tube. °· Delivering your baby early, if your condition gets worse. You may have your labor started with medicine (induced), or you may have a cesarean delivery. ° °Follow these instructions at home: °Eating and drinking ° °· Drink enough fluid to keep your urine clear or pale yellow. °· Eat a healthy diet that is low in sodium. Do not add salt to your food. Check nutrition labels to see how much sodium a food or beverage contains. °· Avoid caffeine. °Lifestyle °· Do not use any products that contain nicotine or tobacco, such as cigarettes   and e-cigarettes. If you need help quitting, ask your health care provider.  Do not use alcohol or drugs.  Avoid stress as much as possible. Rest and get plenty of sleep. General instructions  Take over-the-counter and prescription medicines only as told by your  health care provider.  When lying down, lie on your side. This keeps pressure off of your baby.  When sitting or lying down, raise (elevate) your feet. Try putting some pillows underneath your lower legs.  Exercise regularly. Ask your health care provider what kinds of exercise are best for you.  Keep all follow-up and prenatal visits as told by your health care provider. This is important. How is this prevented? To prevent preeclampsia or eclampsia from developing during another pregnancy:  Get proper medical care during pregnancy. Your health care provider may be able to prevent preeclampsia or diagnose and treat it early.  Your health care provider may have you take a low-dose aspirin or a calcium supplement during your next pregnancy.  You may have tests of your blood pressure and kidney function after giving birth.  Maintain a healthy weight. Ask your health care provider for help managing weight gain during pregnancy.  Work with your health care provider to manage any long-term (chronic) health conditions you have, such as diabetes or kidney problems.  Contact a health care provider if:  You gain more weight than expected.  You have headaches.  You have nausea or vomiting.  You have abdominal pain.  You feel dizzy or light-headed. Get help right away if:  You develop sudden or severe swelling anywhere in your body. This usually happens in the legs.  You gain 5 lbs (2.3 kg) or more during one week.  You have severe: ? Abdominal pain. ? Headaches. ? Dizziness. ? Vision problems. ? Confusion. ? Nausea or vomiting.  You have a seizure.  You have trouble moving any part of your body.  You develop numbness in any part of your body.  You have trouble speaking.  You have any abnormal bleeding.  You pass out. This information is not intended to replace advice given to you by your health care provider. Make sure you discuss any questions you have with your health  care provider. Document Released: 02/15/2000 Document Revised: 10/16/2015 Document Reviewed: 09/24/2015 Elsevier Interactive Patient Education  2018 [employer].  Postpartum Hypertension Postpartum hypertension is high blood pressure after pregnancy that remains higher than normal for more than two days after delivery. You may not realize that you have postpartum hypertension if your blood pressure is not being checked regularly. In some cases, postpartum hypertension will go away on its own, usually within a week of delivery. However, for some women, medical treatment is required to prevent serious complications, such as seizures or stroke. The following things can affect your blood pressure:  The type of delivery you had.  Having received IV fluids or other medicines during or after delivery.  What are the causes? Postpartum hypertension may be caused by any of the following or by a combination of any of the following:  Hypertension that existed before pregnancy (chronic hypertension).  Gestational hypertension.  Preeclampsia or eclampsia.  Receiving a lot of fluid through an IV during or after delivery.  Medicines.  HELLP syndrome.  Hyperthyroidism.  Stroke.  Other rare neurological or blood disorders.  In some cases, the cause may not be known. What increases the risk? Postpartum hypertension can be related to one or more risk factors, such as:  Chronic hypertension. In some cases, this may not have been diagnosed before pregnancy.  Obesity.  Type 2 diabetes.  Kidney disease.  Family history of preeclampsia.  Other medical conditions that cause hormonal imbalances.  What are the signs or symptoms? As with all types of hypertension, postpartum hypertension may not have any symptoms. Depending on how high your blood pressure is, you may experience:  Headaches. These may be mild, moderate, or severe. They may also be steady, constant, or sudden in onset  (thunderclap headache).  Visual changes.  Dizziness.  Shortness of breath.  Swelling of your hands, feet, lower legs, or face. In some cases, you may have swelling in more than one of these locations.  Heart palpitations or a racing heartbeat.  Difficulty breathing while lying down.  Decreased urination.  Other rare signs and symptoms may include:  Sweating more than usual. This lasts longer than a few days after delivery.  Chest pain.  Sudden dizziness when you get up from sitting or lying down.  Seizures.  Nausea or vomiting.  Abdominal pain.  How is this diagnosed? The diagnosis of postpartum hypertension is made through a combination of physical examination findings and testing of your blood and urine. You may also have additional tests, such as a CT scan or an MRI, to check for other complications of postpartum hypertension. How is this treated? When blood pressure is high enough to require treatment, your options may include:  Medicines to reduce blood pressure (antihypertensives). Tell your health care provider if you are breastfeeding or if you plan to breastfeed. There are many antihypertensive medicines that are safe to take while breastfeeding.  Stopping medicines that may be causing hypertension.  Treating medical conditions that are causing hypertension.  Treating the complications of hypertension, such as seizures, stroke, or kidney problems.  Your health care provider will also continue to monitor your blood pressure closely and repeatedly until it is within a safe range for you. Follow these instructions at home:  Take medicines only as directed by your health care provider.  Get regular exercise after your health care provider tells you that it is safe.  Follow your health care providers recommendations on fluid and salt restrictions.  Do not use any tobacco products, including cigarettes, chewing tobacco, or electronic cigarettes. If you need  help quitting, ask your health care provider.  Keep all follow-up visits as directed by your health care provider. This is important. Contact a health care provider if:  Your symptoms get worse.  You have new symptoms, such as: ? Headache. ? Dizziness. ? Visual changes. Get help right away if:  You develop a severe or sudden headache.  You have seizures.  You develop numbness or weakness on one side of your body.  You have difficulty thinking, speaking, or swallowing.  You develop severe abdominal pain.  You develop difficulty breathing, chest pain, a racing heartbeat, or heart palpitations. These symptoms may represent a serious problem that is an emergency. Do not wait to see if the symptoms will go away. Get medical help right away. Call your local emergency services (911 in the [place] Do not drive yourself to the hospital. This information is not intended to replace advice given to you by your health care provider. Make sure you discuss any questions you have with your health care provider. Document Released: 10/21/2013 Document Revised: 07/23/2015 Document Reviewed: 09/01/2013 Elsevier Interactive Patient Education  [employer].

## 2017-01-25 DIAGNOSIS — R7989 Other specified abnormal findings of blood chemistry: Secondary | ICD-10-CM | POA: Insufficient documentation

## 2017-01-25 NOTE — Progress Notes (Signed)
Subjective:    Patient ID: Angelica [name], female    DOB: Jul 21, 1985, 31 y.o.   MRN: [id number]  HPI The patient is here for follow up.  She developed severe preeclampsia in her pregnancy requiring a C-section at 84 weeks.  Unfortunately her baby did not survive.  She did have renal involvement and her creatinine went up to 1.3.  She did receive magnesium.  Over 3 days postpartum her creatinine and liver tests improved.  She was discharged with labetalol 200 mg 3 times daily.  The postpartum follow-up her blood pressures were good.  Her creatinine was elevated, liver tests were normal.  Several days later she called her gynecologist with fuzziness in her vision and her blood pressure was elevated.  She did go to the hospital for further evaluation.  She did receive Ativan on admission.  Her vision improved.  Her labetalol was increased to 400 mg 3 times daily.  She was discharged home.  She has begun grief counseling.  She is here today for follow-up of hypertension and elevated creatinine.  At the beginning of this month she was taking advil 600 mg every 6 hrs.  She has not taken that for a few days.  Some days she drinks a lot of water, other days she does not.    On 01/11/17 her BP at home was 110/70.  She has not been checking it a lot at home due to anxiety.  Her gynecologist recommended that she does not take it unless she is symptomatic because it does cause anxiety.  She has been compliant with a low-sodium diet.  She has been walking 1-2 miles a day.    Besides for the grieving she physically feels okay.  She is still healing from the C-section, but the pain is improving and she has not needed to take anything over the past couple of days.  It is more painful when she is up and moving around.  Iron deficiency anemia: She was anemic during her pregnancy and that got worse after the C-section.  GYN advised that she take iron, which she has been doing regularly.  She wondered if she  needed to continue that at this point.  Medications and allergies reviewed with patient and updated if appropriate.  Patient Active Problem List   Diagnosis Date Noted  . Elevated serum creatinine 01/25/2017  . HELLP syndrome 12/30/2016  . Acute blood loss anemia 12/30/2016  . Cesarean delivery delivered - severe PEC, 25 wks, classic incision 10/27 12/28/2016  . Postpartum care following cesarean delivery 12/28/2016  . Severe preeclampsia 12/26/2016  . Chronic hypertension with exacerbation during pregnancy in second trimester 12/25/2016  . Hypertension 04/13/2016  . Echocardiogram shows left ventricular diastolic dysfunction 41/66/0630  . Immune to hepatitis B 02/14/2015  . Anxiety and depression 02/14/2013  . ADD (attention deficit disorder) 02/14/2013  . Allergic rhinitis 02/14/2013    Current Outpatient Medications on File Prior to Visit  Medication Sig Dispense Refill  . busPIRone (BUSPAR) 15 MG tablet Take 15 mg by mouth 3 (three) times daily.    Marland Kitchen EPINEPHrine 0.3 mg/0.3 mL IJ SOAJ injection INJECT 0.3MLS INTO THE MUSCLE ONCE  5  . ferrous sulfate 325 (65 FE) MG tablet Take 1 tablet (325 mg total) by mouth daily. 30 tablet 3  . folic acid (FOLVITE) 1 MG tablet Take 1 tablet (1 mg total) by mouth daily. 90 tablet 3  . ibuprofen (ADVIL,MOTRIN) 600 MG tablet Take 1 tablet (600 mg  total) by mouth every 6 (six) hours. (Patient taking differently: Take 600 mg by mouth every 6 (six) hours as needed. ) 30 tablet 0  . labetalol (NORMODYNE) 200 MG tablet Take 2 tablets (400 mg total) every 8 (eight) hours by mouth. 180 tablet 0  . loratadine (CLARITIN) 10 MG tablet Take 10 mg by mouth daily as needed for allergies or rhinitis.    Marland Kitchen LORazepam (ATIVAN) 0.5 MG tablet Take 1 tablet (0.5 mg total) by mouth every 6 (six) hours as needed for anxiety. 20 tablet 0  . prenatal vitamin w/FE, FA (PRENATAL 1 + 1) 27-1 MG TABS tablet Take 1 tablet by mouth daily at 12 noon.    . sertraline (ZOLOFT) 100  MG tablet Take 2 tablets (200 mg total) by mouth daily. 60 tablet 2   No current facility-administered medications on file prior to visit.     Past Medical History:  Diagnosis Date  . Allergy   . Anxiety   . Chronic hypertension   . Depression   . Pregnancy induced hypertension   . Tracheoesophageal fistula [facility])     Past Surgical History:  Procedure Laterality Date  . CESAREAN SECTION N/A 12/27/2016   Procedure: CESAREAN SECTION;  Surgeon: Aloha Gell, MD;  Location: [place];  Service: Obstetrics;  Laterality: N/A;  . DILATION AND CURETTAGE OF UTERUS  01/2013  . ESOPHAGUS SURGERY    . TRACHEAL SURGERY      Social History   Socioeconomic History  . Marital status: Single    Spouse name: n/a  . Number of children: 0  . Years of education: Master's  . Highest education level: None  Social Needs  . Financial resource strain: None  . Food insecurity - worry: None  . Food insecurity - inability: None  . Transportation needs - medical: None  . Transportation needs - non-medical: None  Occupational History  . 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 Occupation: [employer]    Comment: private practice  Tobacco Use  . Smoking status: Former Research scientist (life sciences)  . Smokeless tobacco: Never Used  Substance and Sexual Activity  . Alcohol use: Yes    Alcohol/week: 3.0 - 6.0 oz    Types: 5 - 10 Standard drinks or equivalent per week    Comment: not since preg  . Drug use: No  . Sexual activity: Yes    Partners: Male  Other Topics Concern  . None  Social History Narrative   Graduated from [employer] 2008.   Lives with her fiance.   Engaged to be married in May 2017.       Family History  Problem Relation Age of Onset  . Diabetes Maternal Grandfather   . Stroke Paternal Grandmother   . Hyperlipidemia Paternal Grandmother   . Mental illness Mother   . Mental illness Sister   . Cancer  Maternal Grandmother     Review of Systems  Constitutional: Negative for chills and fever.  Eyes: Negative for visual disturbance.  Respiratory: Positive for cough (allergies ?). Negative for shortness of breath and wheezing.   Cardiovascular: Negative for chest pain, palpitations and leg swelling.  Neurological: Positive for light-headedness (once two weeks ago). Negative for dizziness and headaches.       Objective:   Vitals:   01/26/17 1006  BP: 124/88  Pulse: 66  Resp: 16  Temp: 98.2 F (36.8 C)  SpO2: 99%  Wt Readings from Last 3 Encounters:  01/26/17 173 lb (78.5 kg)  12/27/16 181 lb 0.8 oz (82.1 kg)  04/14/16 166 lb (75.3 kg)   Body mass index is 28.79 kg/m.   Physical Exam    Constitutional: Appears well-developed and well-nourished. No distress.  HENT:  Head: Normocephalic and atraumatic.  Neck: Neck supple. No tracheal deviation present. No thyromegaly present.  No cervical lymphadenopathy Cardiovascular: Normal rate, regular rhythm and normal heart sounds.   No murmur heard. No carotid bruit .  No edema Pulmonary/Chest: Effort normal and breath sounds normal. No respiratory distress. No has no wheezes. No rales.  Skin: Skin is warm and dry. Not diaphoretic.  Psychiatric: Normal mood and affect. Behavior is normal.      Assessment & Plan:    See Problem List for Assessment and Plan of chronic medical problems.

## 2017-01-25 NOTE — Patient Instructions (Addendum)
  Test(s) ordered today. Your results will be released to [facility] (or called to you) after review, usually within 72hours after test completion. If any changes need to be made, you will be notified at that same time.  No immunizations administered today.   Medications reviewed and updated.  No changes recommended at this time.  Your prescription(s) have been submitted to your pharmacy. Please take as directed and contact our office if you believe you are having problem(s) with the medication(s).  Please followup in 6 weeks

## 2017-01-26 ENCOUNTER — Other Ambulatory Visit (INDEPENDENT_AMBULATORY_CARE_PROVIDER_SITE_OTHER): Payer: 59

## 2017-01-26 ENCOUNTER — Encounter: Payer: Self-pay | Admitting: Internal Medicine

## 2017-01-26 ENCOUNTER — Ambulatory Visit (INDEPENDENT_AMBULATORY_CARE_PROVIDER_SITE_OTHER): Payer: 59 | Admitting: Internal Medicine

## 2017-01-26 VITALS — BP 124/88 | HR 66 | Temp 98.2°F | Resp 16 | Wt 173.0 lb

## 2017-01-26 DIAGNOSIS — D509 Iron deficiency anemia, unspecified: Secondary | ICD-10-CM

## 2017-01-26 DIAGNOSIS — I10 Essential (primary) hypertension: Secondary | ICD-10-CM

## 2017-01-26 DIAGNOSIS — R7989 Other specified abnormal findings of blood chemistry: Secondary | ICD-10-CM

## 2017-01-26 LAB — COMPREHENSIVE METABOLIC PANEL
ALT: 15 U/L (ref 0–35)
AST: 16 U/L (ref 0–37)
Albumin: 4.2 g/dL (ref 3.5–5.2)
Alkaline Phosphatase: 44 U/L (ref 39–117)
BILIRUBIN TOTAL: 0.5 mg/dL (ref 0.2–1.2)
BUN: 10 mg/dL (ref 6–23)
CALCIUM: 10.1 mg/dL (ref 8.4–10.5)
CHLORIDE: 106 meq/L (ref 96–112)
CO2: 26 meq/L (ref 19–32)
Creatinine, Ser: 1.14 mg/dL (ref 0.40–1.20)
GFR: 58.53 mL/min — AB (ref >60.00)
GLUCOSE: 105 mg/dL — AB (ref 70–99)
POTASSIUM: 4.3 meq/L (ref 3.5–5.1)
Sodium: 140 mEq/L (ref 135–145)
Total Protein: 6.7 g/dL (ref 6.0–8.3)

## 2017-01-26 LAB — CBC WITH DIFFERENTIAL/PLATELET
BASOS ABS: 0.1 10*3/uL (ref 0.0–0.1)
Basophils Relative: 0.9 % (ref 0.0–3.0)
EOS PCT: 5.2 % — AB (ref 0.0–5.0)
Eosinophils Absolute: 0.3 10*3/uL (ref 0.0–0.7)
HEMATOCRIT: 41 % (ref 36.0–46.0)
Hemoglobin: 13.6 g/dL (ref 12.0–15.0)
LYMPHS ABS: 2 10*3/uL (ref 0.7–4.0)
LYMPHS PCT: 32.2 % (ref 12.0–46.0)
MCHC: 33.3 g/dL (ref 30.0–36.0)
MCV: 89.4 fl (ref 78.0–100.0)
MONOS PCT: 5.5 % (ref 3.0–12.0)
Monocytes Absolute: 0.3 10*3/uL (ref 0.1–1.0)
NEUTROS ABS: 3.5 10*3/uL (ref 1.4–7.7)
Neutrophils Relative %: 56.2 % (ref 43.0–77.0)
PLATELETS: 199 10*3/uL (ref 150.0–400.0)
RBC: 4.58 Mil/uL (ref 3.87–5.11)
RDW: 13.3 % (ref 11.5–15.5)
WBC: 6.1 10*3/uL (ref 4.0–10.5)

## 2017-01-26 LAB — URINALYSIS, ROUTINE W REFLEX MICROSCOPIC
Bilirubin Urine: NEGATIVE
Hgb urine dipstick: NEGATIVE
KETONES UR: NEGATIVE
LEUKOCYTES UA: NEGATIVE
NITRITE: NEGATIVE
RBC / HPF: NONE SEEN (ref 0–?)
SPECIFIC GRAVITY, URINE: 1.01 (ref 1.000–1.030)
Total Protein, Urine: NEGATIVE
URINE GLUCOSE: NEGATIVE
Urobilinogen, UA: 0.2 (ref 0.0–1.0)
WBC, UA: NONE SEEN (ref 0–?)
pH: 7 (ref 5.0–8.0)

## 2017-01-26 LAB — IRON: IRON: 246 ug/dL — AB (ref 42–145)

## 2017-01-26 LAB — MICROALBUMIN / CREATININE URINE RATIO
CREATININE, U: 52.5 mg/dL
MICROALB UR: 3.2 mg/dL — AB (ref 0.0–1.9)
MICROALB/CREAT RATIO: 6 mg/g (ref 0.0–30.0)

## 2017-01-26 LAB — FERRITIN: FERRITIN: 26.6 ng/mL (ref 10.0–291.0)

## 2017-01-26 MED ORDER — LABETALOL HCL 200 MG PO TABS: 400.0000 mg | ORAL_TABLET | Freq: Three times a day (TID) | ORAL | 5 refills | Status: DC

## 2017-01-26 NOTE — Assessment & Plan Note (Signed)
Initially her elevated creatinine was related to severe preeclampsia and help syndrome Creatinine did improve nicely with her liver function tests and platelets, but then rose again when her other numbers remain normal ?  Related to mild dehydration and NSAID use CMP, urinalysis, urine microalbumin If abnormal will consider nephrology referral Avoid NSAIDs Increase water intake

## 2017-01-26 NOTE — Assessment & Plan Note (Signed)
Related to pregnancy and C-section Taking iron Most likely she should be able to stop the iron, But will check CBC, iron, ferritin and advise further

## 2017-01-26 NOTE — Assessment & Plan Note (Signed)
Blood pressure looks good here today and has been normal the past couple of times that has been checked She will not monitor it at home regularly because it does increase her anxiety She will check her blood pressure at home if she is symptomatic Continue labetalol 400 mg 3 times daily CMP today

## 2017-01-28 ENCOUNTER — Telehealth: Payer: Self-pay | Admitting: Emergency Medicine

## 2017-01-28 NOTE — Telephone Encounter (Signed)
Copied from [place] [id number]. Topic: General - Other >> Jan 28, 2017  2:22 PM Neva Seat wrote: Pt has 8 weeks of work leave from her [facility] doctor which ends Dec 21.  On Dec 10.  Pt and OB dr will discuss the remaining 4 weeks.  If OB approves to be her primary care giver for the extended 4 weeks she will not need her appt with Dr. Quay Burow on the Dec. 14th.   Pt will call back to cancel appt with Dr. Quay Burow if she isn't needed for the approval of remaining 4 weeks.

## 2017-01-28 NOTE — Telephone Encounter (Signed)
Noted  

## 2017-02-12 ENCOUNTER — Encounter: Payer: Self-pay | Admitting: Internal Medicine

## 2017-02-12 ENCOUNTER — Ambulatory Visit: Payer: 59 | Admitting: Internal Medicine

## 2017-02-12 ENCOUNTER — Ambulatory Visit (INDEPENDENT_AMBULATORY_CARE_PROVIDER_SITE_OTHER): Payer: 59 | Admitting: Internal Medicine

## 2017-02-12 VITALS — BP 110/72 | HR 68 | Temp 97.8°F | Resp 16 | Wt 173.0 lb

## 2017-02-12 DIAGNOSIS — F419 Anxiety disorder, unspecified: Secondary | ICD-10-CM | POA: Diagnosis not present

## 2017-02-12 DIAGNOSIS — I10 Essential (primary) hypertension: Secondary | ICD-10-CM

## 2017-02-12 DIAGNOSIS — F329 Major depressive disorder, single episode, unspecified: Secondary | ICD-10-CM

## 2017-02-12 DIAGNOSIS — F32A Depression, unspecified: Secondary | ICD-10-CM

## 2017-02-12 DIAGNOSIS — R7989 Other specified abnormal findings of blood chemistry: Secondary | ICD-10-CM | POA: Diagnosis not present

## 2017-02-12 NOTE — Patient Instructions (Signed)
  Test(s) ordered today. Your results will be released to MyChart (or called to you) after review, usually within 72hours after test completion. If any changes need to be made, you will be notified at that same time.  Medications reviewed and updated.  No changes recommended at this time.    Please followup in 6 months   

## 2017-02-12 NOTE — Assessment & Plan Note (Signed)
BP very good here today  Not checking it at home due to it causing increased anxiety Monitor for lightheadedness - can check BP if needed Continue current dose of medication - may need to decrease dose in near future

## 2017-02-12 NOTE — Progress Notes (Signed)
Subjective:    Patient ID: Angelica [name], female    DOB: [birth year], 31 y.o.   MRN: [id number]  HPI The patient is here for follow up.  Hypertension: She is taking her medication daily. She is compliant with a low sodium diet.  She denies chest pain, palpitations, edema, shortness of breath and regular headaches.   Anxiety, depression, grieving:  She is seeing her psychiatrist and taking her medication daily.  She has been out of work and needs additional time off of work.  She feels she is doing well physically, but emotionally still struggling.  She requires a note to the insurance - Fax [phone] - attn - karen rezza,   Phone  - [phone] - [facility]  She worries about her kidney function.  Her last blood work showed improvement.  She denies changes in urination.  She is trying to drink enough water during the day, but some days does not.  She is not taking any nsaids.     Medications and allergies reviewed with patient and updated if appropriate.  Patient Active Problem List   Diagnosis Date Noted  . Iron deficiency anemia 01/26/2017  . Elevated serum creatinine 01/25/2017  . HELLP syndrome 12/30/2016  . Acute blood loss anemia 12/30/2016  . Cesarean delivery delivered - severe PEC, 25 wks, classic incision 10/27 12/28/2016  . Postpartum care following cesarean delivery 12/28/2016  . Severe preeclampsia 12/26/2016  . Chronic hypertension with exacerbation during pregnancy in second trimester 12/25/2016  . Hypertension 04/13/2016  . Echocardiogram shows left ventricular diastolic dysfunction 78/67/6720  . Immune to hepatitis B 02/14/2015  . Anxiety and depression 02/14/2013  . ADD (attention deficit disorder) 02/14/2013  . Allergic rhinitis 02/14/2013    Current Outpatient Medications on File Prior to Visit  Medication Sig Dispense Refill  . busPIRone (BUSPAR) 15 MG tablet Take 15 mg by mouth 3 (three) times daily.    Marland Kitchen EPINEPHrine 0.3 mg/0.3 mL IJ SOAJ  injection INJECT 0.3MLS INTO THE MUSCLE ONCE  5  . labetalol (NORMODYNE) 200 MG tablet Take 2 tablets (400 mg total) by mouth every 8 (eight) hours. 180 tablet 5  . loratadine (CLARITIN) 10 MG tablet Take 10 mg by mouth daily as needed for allergies or rhinitis.    Marland Kitchen LORazepam (ATIVAN) 0.5 MG tablet Take 1 tablet (0.5 mg total) by mouth every 6 (six) hours as needed for anxiety. 20 tablet 0  . prenatal vitamin w/FE, FA (PRENATAL 1 + 1) 27-1 MG TABS tablet Take 1 tablet by mouth daily at 12 noon.    . sertraline (ZOLOFT) 100 MG tablet Take 2 tablets (200 mg total) by mouth daily. 60 tablet 2   No current facility-administered medications on file prior to visit.     Past Medical History:  Diagnosis Date  . Allergy   . Anxiety   . Chronic hypertension   . Depression   . Pregnancy induced hypertension   . Tracheoesophageal fistula [facility])     Past Surgical History:  Procedure Laterality Date  . CESAREAN SECTION N/A 12/27/2016   Procedure: CESAREAN SECTION;  Surgeon: Aloha Gell, MD;  Location: [place];  Service: Obstetrics;  Laterality: N/A;  . DILATION AND CURETTAGE OF UTERUS  01/2013  . ESOPHAGUS SURGERY    . TRACHEAL SURGERY      Social History   Socioeconomic History  . Marital status: Single    Spouse name: n/a  . Number of children: 0  . Years of education:  Master's  . Highest education level: None  Social Needs  . Financial resource strain: None  . Food insecurity - worry: None  . Food insecurity - inability: None  . Transportation needs - medical: None  . Transportation needs - non-medical: None  Occupational History  . 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 Occupation: [employer]    Comment: private practice  Tobacco Use  . Smoking status: Former Research scientist (life sciences)  . Smokeless tobacco: Never Used  Substance and Sexual Activity  . Alcohol use: Yes    Alcohol/week: 3.0 - 6.0 oz    Types:  5 - 10 Standard drinks or equivalent per week    Comment: not since preg  . Drug use: No  . Sexual activity: Yes    Partners: Male  Other Topics Concern  . None  Social History Narrative   Graduated from [employer] 2008.   Lives with her fiance.   Engaged to be married in May 2017.       Family History  Problem Relation Age of Onset  . Diabetes Maternal Grandfather   . Stroke Paternal Grandmother   . Hyperlipidemia Paternal Grandmother   . Mental illness Mother   . Mental illness Sister   . Cancer Maternal Grandmother     Review of Systems  Constitutional: Negative for fever.  Respiratory: Negative for cough, shortness of breath and wheezing.   Cardiovascular: Negative for chest pain, palpitations and leg swelling.  Genitourinary: Negative for dysuria and hematuria.  Neurological: Negative for light-headedness and headaches.       Objective:   Vitals:   02/12/17 0841  BP: 110/72  Pulse: 68  Resp: 16  Temp: 97.8 F (36.6 C)  SpO2: 98%   Wt Readings from Last 3 Encounters:  02/12/17 173 lb (78.5 kg)  01/26/17 173 lb (78.5 kg)  12/27/16 181 lb 0.8 oz (82.1 kg)   Body mass index is 28.79 kg/m.   Physical Exam    Constitutional: Appears well-developed and well-nourished. No distress.  HENT:  Head: Normocephalic and atraumatic.  Neck: Neck supple. No tracheal deviation present. No thyromegaly present.  No cervical lymphadenopathy Cardiovascular: Normal rate, regular rhythm and normal heart sounds.   No murmur heard. No carotid bruit .  No edema Pulmonary/Chest: Effort normal and breath sounds normal. No respiratory distress. No has no wheezes. No rales.  Skin: Skin is warm and dry. Not diaphoretic.  Psychiatric: anxious mood and affect. Behavior is normal.      Assessment & Plan:    See Problem List for Assessment and Plan of chronic medical problems.

## 2017-02-12 NOTE — Assessment & Plan Note (Signed)
Following with psychiatry Increased anxiety and depression now due to loss of her baby Management per pscyh Will write letter to extend time off of work which I feel is necessary

## 2017-02-12 NOTE — Assessment & Plan Note (Signed)
BMP in next few weeks Increase water Avoid nsaids Keep BP well controlled

## 2017-02-16 ENCOUNTER — Telehealth: Payer: Self-pay | Admitting: Internal Medicine

## 2017-02-16 NOTE — Telephone Encounter (Signed)
Copied from [place] [id number]. Topic: Quick Communication - See Telephone Encounter >> Feb 16, 2017  9:45 AM Burnis Medin, NT wrote: CRM for notification. See Telephone encounter for: Santiago Glad is calling to follow up to see if the doctor faxed over a note to extended pt disability coverage. Fax number is [phone] Must be in as soon as possible.  02/16/17.

## 2017-02-16 NOTE — Telephone Encounter (Signed)
Form was faxed over last week. I will refax the form.   Patient has been informed.

## 2017-03-09 ENCOUNTER — Ambulatory Visit: Payer: 59 | Admitting: Internal Medicine

## 2017-04-28 ENCOUNTER — Other Ambulatory Visit (INDEPENDENT_AMBULATORY_CARE_PROVIDER_SITE_OTHER): Payer: 59

## 2017-04-28 DIAGNOSIS — R7989 Other specified abnormal findings of blood chemistry: Secondary | ICD-10-CM

## 2017-04-28 LAB — BASIC METABOLIC PANEL
BUN: 13 mg/dL (ref 6–23)
CALCIUM: 9.6 mg/dL (ref 8.4–10.5)
CO2: 25 meq/L (ref 19–32)
CREATININE: 1.06 mg/dL (ref 0.40–1.20)
Chloride: 106 mEq/L (ref 96–112)
GFR: 63.56 mL/min (ref >60.00)
GLUCOSE: 130 mg/dL — AB (ref 70–99)
Potassium: 3.8 mEq/L (ref 3.5–5.1)
Sodium: 137 mEq/L (ref 135–145)

## 2017-04-29 ENCOUNTER — Encounter: Payer: Self-pay | Admitting: Emergency Medicine

## 2017-06-05 ENCOUNTER — Encounter (HOSPITAL_COMMUNITY): Payer: Self-pay

## 2017-06-09 ENCOUNTER — Ambulatory Visit (HOSPITAL_COMMUNITY)
Admission: RE | Admit: 2017-06-09 | Discharge: 2017-06-09 | Disposition: A | Discharge status: Home / Self Care | Payer: 59 | Source: Ambulatory Visit | Attending: Obstetrics | Admitting: Obstetrics

## 2017-06-09 HISTORY — DX: HELLP syndrome (HELLP), unspecified trimester: O14.20

## 2017-06-11 ENCOUNTER — Encounter (HOSPITAL_COMMUNITY): Payer: Self-pay

## 2017-06-11 ENCOUNTER — Ambulatory Visit (HOSPITAL_COMMUNITY)
Admission: RE | Admit: 2017-06-11 | Discharge: 2017-06-11 | Disposition: A | Discharge status: Home / Self Care | Payer: 59 | Source: Ambulatory Visit | Attending: Obstetrics | Admitting: Obstetrics

## 2017-06-11 DIAGNOSIS — I10 Essential (primary) hypertension: Secondary | ICD-10-CM | POA: Diagnosis not present

## 2017-06-11 HISTORY — DX: Attention-deficit hyperactivity disorder, unspecified type: F90.9

## 2017-06-11 NOTE — ED Notes (Signed)
Pt in for preconception consult.  BP 112/76, pulse 82, weight 166.4lb.

## 2017-06-16 DIAGNOSIS — R7989 Other specified abnormal findings of blood chemistry: Secondary | ICD-10-CM | POA: Diagnosis not present

## 2017-06-16 DIAGNOSIS — O1420 HELLP syndrome (HELLP), unspecified trimester: Secondary | ICD-10-CM | POA: Diagnosis not present

## 2017-06-16 DIAGNOSIS — O1412 Severe pre-eclampsia, second trimester: Secondary | ICD-10-CM | POA: Diagnosis not present

## 2017-06-16 DIAGNOSIS — I129 Hypertensive chronic kidney disease with stage 1 through stage 4 chronic kidney disease, or unspecified chronic kidney disease: Secondary | ICD-10-CM | POA: Diagnosis not present

## 2017-06-19 ENCOUNTER — Other Ambulatory Visit: Payer: Self-pay | Admitting: Nephrology

## 2017-06-19 DIAGNOSIS — I129 Hypertensive chronic kidney disease with stage 1 through stage 4 chronic kidney disease, or unspecified chronic kidney disease: Secondary | ICD-10-CM

## 2017-06-23 ENCOUNTER — Other Ambulatory Visit: Payer: Self-pay

## 2017-06-23 NOTE — Consult Note (Signed)
I had the pleasure of seeing your patient Angelica [name] for a Maternal-Fetal Medicine consultation on 06/11/2017. As you know, Angelica [name] is a 32 y.o. G2P0110 who presents for a preconception consultation regarding a recent history of chronic hypertension with early-onset preeclampsia with severe features/HELLP syndrome and neonatal demise.  Angelica [name]'s issues include the following:  1. Chronic hypertension complicated by early onset superimposed preeclampsia with severe features and neonatal demise: Angelica [name] reports receiving the diagnosis of chronic hypertension several years ago. In pregnancy her blood pressures were controlled on a stable dose of labetalol 100mg  BID until 24 weeks. At that time she began to experience increasing blood pressures associated with a worsening headache and was ultimately admitted with concern for superimposed preeclampsia at [redacted]w[redacted]d. At that time she required multiple doses of IV labetalol to control her blood pressure and was increased to the maximum dose of oral labetalol but did not require an additional oral agent. Lab evaluation at that time was notable for mildly elevated LFTs in the 40s, platelets in the 130s, an elevated creatinine of 1.04, and a 24 hour urine showing 3 grams of protein. She received magnesium and a course of betamethasone. An obstetric ultrasound showed intrauterine growth restriction with an estimated fetal weight of 543g in the 18%ile with abdominal circumference less than the 3%ile. MFM and neonatology were consulted. Angelica [name] was managed expectantly through the steroid window but ultimately the decision was made to proceed with delivery at [redacted]w[redacted]d due to rising creatinine of 1.26. During this time her urine output was noted to be marginal. She underwent a classical cesarean delivery. Her daughter weighed 640g at birth and was initially thought to have died in the delivery room but ultimately was transferred to the NICU where she died the next day.    Angelica [name]'s creatinine peaked at 1.3 on POD2. Most recently it was 1.06 on 04/28/08. Platelets and LFTs also subsequently normalized. Blood pressure is currently controlled on labetalol 200mg  TID. Angelica [name] and her husband understandably continue to deal with significant grief.   Angelica [name] denies any known history of renal disease. However, her husband has brought a record of all lab values over the past several years which show a creatinine as elevated as 1.2 outside of pregnancy which is higher than I would expect for [place].  2. Personal history of congenital tracheoesophageal fistula with vascular ring: Angelica [name] reports she underwent 12 surgeries for this between birth and age 50. She has had no subsequent complications. She is not aware of any renal complications during this time but will check with her mother for further information. Most recent echocardiogram was normal in 2018. A fetal echocardiogram was normal for her daughter.   3. MTHFR mutation: Angelica [name] was diagnosed with this in the setting of the thrombophilia evaluation for her history of early onset HELLP. She is a heterozygote for the C677T variant. The remainder of the thrombophilia evaluation was negative (Factor V Leiden, prothrombin gene mutation, antiphospholipid antibody syndrome, antithrombin III, protein C&S).  4. Anxiety: Angelica [name] is understandably anxious and sad returning to [facility] today. She is well supported by family but is having difficulty with returning to work and is concerned about how to discuss her daughter's death with patients. She takes sertraline and Buspar daily and rarely uses Ativan as needed. She is attending grief counseling which she finds helpful.   The remainder of [name]'s medical history is unremarkable. Her past surgical history is significant for cesarean delivery and multiple surgeries in childhood as noted above. She takes labetalol, ativan,  sertraline, prenatal vitamins, Focalin, Buspar, and  low-dose aspirin and is allergic to latex. Angelica [name] denies alcohol, tobacco or other drug use. Her family history is noncontributory.  We discussed the following issues during her visit today:  1. Chronic hypertension complicated by early onset superimposed preeclampsia with severe features and neonatal demise: We discussed that the recurrence risk of preeclampsia/HELLP syndrome varies widely depending on the severity and gestational age of diagnosis in the incident case. Several medical comorbidities dramatically affect the likelihood of preeclampsia/HELLP and its recurrence such as hypertension, diabetes, renal disease, obesity, connective tissue disease, thrombophilias, age and history of pregnancy complications such as fetal growth restriction, abruption and stillbirth. Current evidence regarding future pregnancy outcomes following HELLP syndrome specifically is more limited. The best evidence suggests a 7% risk of recurrent HELLP syndrome and around a 20% risk of preeclampsia. Given the very early gestational age at which Angelica [name] developed superimposed preeclampsia with severe features and her diagnosis of chronic hypertension, I would estimate her risk to be higher than this, with a perhaps 20-30% risk of preeclampsia in a future pregnancy, with HELLP being less likely.   I am also suspicious that Angelica [name] may have some mild underlying renal pathology that may have contributed to the early onset preeclampsia given the major manifestation was worsening creatinine and what sounds like oliguria. I am glad that she has a consultation scheduled with Dr. Jimmy Footman of [facility] for further evaluation. While she does not have any history specific for renal pathology, it is possible she may have experienced some sort of renal insult in childhood due to her frequent surgeries.    We discussed surveillance and prevention strategies for recurrent preeclampsia. I reviewed the signs and symptoms  of preeclampsia and stressed the importance of early diagnosis and management.  I also recommend baseline labs early in pregnancy to include a creatinine, platelet count, and urine protein evaluation (24hr urine protein) to evaluate for sequelae of hypertension and also to aid in the evaluation for preeclampsia later in pregnancy if needed. Angelica [name] understands that options for preventing recurrent preeclampsia are limited. I recommend that she take an 81mg  aspirin daily beginning around 12-13 weeks, while acknowledging that there is only a very modest (~10%) reduction in the risk of preeclampsia recurrence with its use. Given the association of chronic hypertension and poor pregnancy outcomes such as growth restriction and stillbirth, I recommend that Angelica [name] have a dating ultrasound in the first trimester, monthly growth ultrasounds beginning at 26-28 weeks, and antenatal beginning at 32 weeks, sooner if growth restriction develops. We discussed my recommendation for a low threshold for admission to the hospital with concern for worsening blood pressures or signs or symptoms of preeclampsia.   2. History of classical cesarean delivery: We reviewed that Angelica [name] was delivered via classical cesarean delivery. We discussed that a vertical hysterotomy increases the risk of uterine rupture, so repeat cesarean delivery between 36 and 37 weeks (earlier if laboring or other indications) is necessary in all future pregnancies.   3. History of congenital TEF with vascular ring s/p repair: Angelica [name] appears to have no residual sequelae from her surgical repairs in childhood. I recommend a detailed anatomic survey at 18 weeks and fetal echocardiogram in any future pregnancy as well as an anesthesia consult early in the third trimester.   4. MTHFR mutation: We discussed that this gene variant is not associated with an increased risk for VTE, miscarriage or other adverse obstetric outcome. However, since women with  MTHFR variants may  have decreased conversion of folic acid, I recommend Angelica [name] choose a prenatal vitamin that contains methylfolate for optimal absorption.  5. Anxiety/depression: Angelica [name] is understandably distraught at returning to the place of her daughter's death and is still deeply grieving. She is well supported and has excellent insight. We discussed the safety of psychiatric medications in the preconception period and in pregnancy. We discussed the safety of both sertraline and Buspar in pregnancy and I encouraged her to continue these as needed. We discussed the safety data regarding benzodiazepines in pregnancy and that we recommend avoiding use in pregnancy, particularly in the first trimester. Angelica [name] uses Ativan very rarely now.   General preconception considerations were also discussed. Angelica [name] should be up to date on all immunizations prior to conception.   Angelica [name] had good questions about the appropriate location of care for any future pregnancies. While Angelica [name] will certainly be high risk in any future pregnancy, I believe it is appropriate for her to continue routine prenatal care through your group as long as no additional complications arise. I would be happy to see Angelica [name] again at any time if you or she desire. We discussed the options of return consultation in the first trimester and/or at the time of detailed anatomy. If Angelica [name] is found to have underlying renal disease or any additional significant medical complications, then I would suggest complete Maternal-Fetal Medicine care in future pregnancies.   Thank you for the opportunity to participate in the care of [facility] during this difficult time. Please do not hesitate to contact me with any questions or concerns regarding my assessment.  I spent 60 minutes face-to-face with the patient and/or family. More than 50% of the total encounter time was spent on counseling and coordination of care.   Abram Sander, MD Maternal-Fetal Medicine

## 2017-06-24 ENCOUNTER — Other Ambulatory Visit: Payer: Self-pay | Admitting: Nephrology

## 2017-06-24 DIAGNOSIS — O1412 Severe pre-eclampsia, second trimester: Secondary | ICD-10-CM

## 2017-06-24 DIAGNOSIS — I129 Hypertensive chronic kidney disease with stage 1 through stage 4 chronic kidney disease, or unspecified chronic kidney disease: Secondary | ICD-10-CM

## 2017-06-30 ENCOUNTER — Other Ambulatory Visit (HOSPITAL_COMMUNITY): Payer: Self-pay | Admitting: Nephrology

## 2017-06-30 DIAGNOSIS — I159 Secondary hypertension, unspecified: Secondary | ICD-10-CM

## 2017-07-03 ENCOUNTER — Ambulatory Visit (HOSPITAL_COMMUNITY)
Admission: RE | Admit: 2017-07-03 | Discharge: 2017-07-03 | Disposition: A | Discharge status: Home / Self Care | Payer: 59 | Source: Ambulatory Visit | Attending: Nephrology | Admitting: Nephrology

## 2017-07-03 DIAGNOSIS — I159 Secondary hypertension, unspecified: Secondary | ICD-10-CM

## 2017-07-03 DIAGNOSIS — F988 Other specified behavioral and emotional disorders with onset usually occurring in childhood and adolescence: Secondary | ICD-10-CM | POA: Insufficient documentation

## 2017-07-03 DIAGNOSIS — Z0000 Encounter for general adult medical examination without abnormal findings: Secondary | ICD-10-CM | POA: Diagnosis present

## 2017-07-03 DIAGNOSIS — O1412 Severe pre-eclampsia, second trimester: Secondary | ICD-10-CM | POA: Insufficient documentation

## 2017-07-03 DIAGNOSIS — J309 Allergic rhinitis, unspecified: Secondary | ICD-10-CM | POA: Diagnosis not present

## 2017-07-03 DIAGNOSIS — I129 Hypertensive chronic kidney disease with stage 1 through stage 4 chronic kidney disease, or unspecified chronic kidney disease: Secondary | ICD-10-CM | POA: Insufficient documentation

## 2017-07-03 DIAGNOSIS — F329 Major depressive disorder, single episode, unspecified: Secondary | ICD-10-CM | POA: Diagnosis not present

## 2017-07-03 NOTE — Progress Notes (Signed)
Preliminary note--Bilateral renal arterial duplex study completed.  No evidence of significant hemodynamic renal stenosis. Incidental finding: Left renal atrophy noted with size 6.67cm on length and cortical thinning, while right renal size measuring 9.93cm on length.   Hongying Roark Rufo (RDMS RVT) 07/03/17 10:44 AM

## 2017-07-07 DIAGNOSIS — O1420 HELLP syndrome (HELLP), unspecified trimester: Secondary | ICD-10-CM | POA: Diagnosis not present

## 2017-07-07 DIAGNOSIS — O1412 Severe pre-eclampsia, second trimester: Secondary | ICD-10-CM | POA: Diagnosis not present

## 2017-07-07 DIAGNOSIS — I129 Hypertensive chronic kidney disease with stage 1 through stage 4 chronic kidney disease, or unspecified chronic kidney disease: Secondary | ICD-10-CM | POA: Diagnosis not present

## 2017-07-09 ENCOUNTER — Other Ambulatory Visit (HOSPITAL_COMMUNITY): Payer: Self-pay | Admitting: Nephrology

## 2017-07-09 ENCOUNTER — Other Ambulatory Visit: Payer: 59

## 2017-07-09 DIAGNOSIS — I701 Atherosclerosis of renal artery: Secondary | ICD-10-CM

## 2017-07-13 ENCOUNTER — Encounter (HOSPITAL_COMMUNITY): Payer: Self-pay

## 2017-07-13 ENCOUNTER — Ambulatory Visit (HOSPITAL_COMMUNITY)
Admission: RE | Admit: 2017-07-13 | Discharge: 2017-07-13 | Disposition: A | Discharge status: Home / Self Care | Payer: 59 | Source: Ambulatory Visit | Attending: Nephrology | Admitting: Nephrology

## 2017-07-13 DIAGNOSIS — I701 Atherosclerosis of renal artery: Secondary | ICD-10-CM | POA: Diagnosis present

## 2017-07-13 DIAGNOSIS — K449 Diaphragmatic hernia without obstruction or gangrene: Secondary | ICD-10-CM | POA: Insufficient documentation

## 2017-07-13 DIAGNOSIS — N2889 Other specified disorders of kidney and ureter: Secondary | ICD-10-CM | POA: Diagnosis not present

## 2017-07-13 MED ORDER — IOPAMIDOL (ISOVUE-370) INJECTION 76%
INTRAVENOUS | Status: AC
  Administered 2017-07-13: 100 mL
  Filled 2017-07-13: qty 100

## 2017-07-15 ENCOUNTER — Encounter: Payer: Self-pay | Admitting: Cardiology

## 2017-07-15 DIAGNOSIS — J860 Pyothorax with fistula: Secondary | ICD-10-CM | POA: Insufficient documentation

## 2017-08-09 ENCOUNTER — Other Ambulatory Visit: Payer: Self-pay | Admitting: Internal Medicine

## 2017-08-10 DIAGNOSIS — O1420 HELLP syndrome (HELLP), unspecified trimester: Secondary | ICD-10-CM | POA: Diagnosis not present

## 2017-08-10 DIAGNOSIS — O1412 Severe pre-eclampsia, second trimester: Secondary | ICD-10-CM | POA: Diagnosis not present

## 2017-08-10 DIAGNOSIS — I129 Hypertensive chronic kidney disease with stage 1 through stage 4 chronic kidney disease, or unspecified chronic kidney disease: Secondary | ICD-10-CM | POA: Diagnosis not present

## 2017-08-12 NOTE — Progress Notes (Signed)
Subjective:    Patient ID: Angelica [name], female    DOB: 02/07/1986, 32 y.o.   MRN: [id number]  HPI The patient is here for follow up.  Hypertension: She is taking her medication daily. She is compliant with a low sodium diet.  She denies chest pain, edema, shortness of breath and regular headaches. She is exercising fairly regularly.  She does monitor her blood pressure at home - 112/82, overall controlled.    Ct Angiogram from nephrology -she had a CT angiogram done last month that showed that her left kidney is smaller than the right.  The left renal artery is smaller than the right.  She is following regularly with nephrology.  Medications and allergies reviewed with patient and updated if appropriate.  Patient Active Problem List   Diagnosis Date Noted  . Tracheoesophageal fistula ([place])   . Elevated serum creatinine 01/25/2017  . HELLP syndrome 12/30/2016  . Cesarean delivery delivered - severe PEC, 25 wks, classic incision 10/27 12/28/2016  . Severe preeclampsia 12/26/2016  . Chronic hypertension with exacerbation during pregnancy in second trimester 12/25/2016  . Hypertension 04/13/2016  . Echocardiogram shows left ventricular diastolic dysfunction 59/93/5701  . Immune to hepatitis B 02/14/2015  . Anxiety and depression 02/14/2013  . ADD (attention deficit disorder) 02/14/2013  . Allergic rhinitis 02/14/2013    Current Outpatient Medications on File Prior to Visit  Medication Sig Dispense Refill  . aspirin 325 MG tablet Take 325 mg by mouth daily.    . busPIRone (BUSPAR) 15 MG tablet Take 15 mg by mouth 3 (three) times daily.    Marland Kitchen Dexmethylphenidate HCl (FOCALIN PO) Take by mouth.    . EPINEPHrine 0.3 mg/0.3 mL IJ SOAJ injection INJECT 0.3MLS INTO THE MUSCLE ONCE  5  . labetalol (NORMODYNE) 200 MG tablet TAKE 2 TABLETS BY MOUTH EVERY 8 HOURS. 180 tablet 0  . loratadine (CLARITIN) 10 MG tablet Take 10 mg by mouth daily as needed for allergies or rhinitis.    Marland Kitchen  LORazepam (ATIVAN) 0.5 MG tablet Take 1 tablet (0.5 mg total) by mouth every 6 (six) hours as needed for anxiety. 20 tablet 0  . prenatal vitamin w/FE, FA (PRENATAL 1 + 1) 27-1 MG TABS tablet Take 1 tablet by mouth daily at 12 noon.    . sertraline (ZOLOFT) 100 MG tablet Take 2 tablets (200 mg total) by mouth daily. 60 tablet 2   No current facility-administered medications on file prior to visit.     Past Medical History:  Diagnosis Date  . ADHD   . Allergy   . Anxiety   . Chronic hypertension   . Depression   . HELLP syndrome 2018  . Pregnancy induced hypertension   . Tracheoesophageal fistula [facility])     Past Surgical History:  Procedure Laterality Date  . CESAREAN SECTION N/A 12/27/2016   Procedure: CESAREAN SECTION;  Surgeon: Aloha Gell, MD;  Location: [place];  Service: Obstetrics;  Laterality: N/A;  . DILATION AND CURETTAGE OF UTERUS  01/2013  . ESOPHAGUS SURGERY    . TRACHEAL SURGERY    . TRACHEOESOPHAGEAL FISTULA REPAIR    . VASCULAR RING REPAIR      Social History   Socioeconomic History  . Marital status: Married    Spouse name: n/a  . Number of children: 0  . Years of education: Master's  . Highest education level: Not on file  Occupational History  . Occupation: Buyer, retail at an agency  Employer: [employer]    Comment: [employer]  . Occupation: [employer]    Comment: Biomedical engineer  Social Needs  . Financial resource strain: Not on file  . Food insecurity:    Worry: Not on file    Inability: Not on file  . Transportation needs:    Medical: Not on file    Non-medical: Not on file  Tobacco Use  . Smoking status: Former Research scientist (life sciences)  . Smokeless tobacco: Never Used  Substance and Sexual Activity  . Alcohol use: Yes    Alcohol/week: 3.0 - 6.0 oz    Types: 5 - 10 Standard drinks or equivalent per week  . Drug use: No  . Sexual activity: Yes    Partners: Male  Lifestyle  . Physical activity:    Days per week:  Not on file    Minutes per session: Not on file  . Stress: Not on file  Relationships  . Social connections:    Talks on phone: Not on file    Gets together: Not on file    Attends religious service: Not on file    Active member of club or organization: Not on file    Attends meetings of clubs or organizations: Not on file    Relationship status: Not on file  Other Topics Concern  . Not on file  Social History Narrative   Graduated from [employer] 2008.   Lives with her fiance.   Engaged to be married in May 2017.       Family History  Problem Relation Age of Onset  . Diabetes Maternal Grandfather   . Stroke Paternal Grandmother   . Hyperlipidemia Paternal Grandmother   . Mental illness Mother   . Mental illness Sister   . Cancer Maternal Grandmother     Review of Systems  Respiratory: Negative for shortness of breath.   Cardiovascular: Positive for palpitations (anxiety related). Negative for chest pain and leg swelling.  Neurological: Negative for light-headedness and headaches.       Objective:   Vitals:   08/13/17 0922  BP: 130/84  Pulse: 71  Resp: 16  Temp: 98.2 F (36.8 C)  SpO2: 98%   BP Readings from Last 3 Encounters:  08/13/17 130/84  02/12/17 110/72  01/26/17 124/88   Wt Readings from Last 3 Encounters:  08/13/17 162 lb (73.5 kg)  02/12/17 173 lb (78.5 kg)  01/26/17 173 lb (78.5 kg)   Body mass index is 26.96 kg/m.   Physical Exam    Constitutional: Appears well-developed and well-nourished. No distress.  HENT:  Head: Normocephalic and atraumatic.  Neck: Neck supple. No tracheal deviation present. No thyromegaly present.  No cervical lymphadenopathy Cardiovascular: Normal rate, regular rhythm and normal heart sounds.   No murmur heard. No carotid bruit .  No edema Pulmonary/Chest: Effort normal and breath sounds normal. No respiratory distress. No has no wheezes. No rales.  Skin: Skin is warm and dry. Not diaphoretic.    Psychiatric: Normal mood and affect. Behavior is normal.      Assessment & Plan:    See Problem List for Assessment and Plan of chronic medical problems.

## 2017-08-13 ENCOUNTER — Encounter: Payer: Self-pay | Admitting: Internal Medicine

## 2017-08-13 ENCOUNTER — Ambulatory Visit: Payer: 59 | Admitting: Internal Medicine

## 2017-08-13 VITALS — BP 130/84 | HR 71 | Temp 98.2°F | Resp 16 | Wt 162.0 lb

## 2017-08-13 DIAGNOSIS — I10 Essential (primary) hypertension: Secondary | ICD-10-CM

## 2017-08-13 MED ORDER — LABETALOL HCL 200 MG PO TABS: ORAL_TABLET | ORAL | 3 refills | Status: DC

## 2017-08-13 NOTE — Assessment & Plan Note (Signed)
Blood pressure at home and here seems to be very well controlled She is also following with nephrology Continue current medication and current dose-refilled today Had blood work done recently with nephrology

## 2017-08-13 NOTE — Patient Instructions (Addendum)
  Medications reviewed and updated.  No changes recommended at this time.    Your prescription was sent to the pharmacy.

## 2017-08-16 ENCOUNTER — Encounter: Payer: Self-pay | Admitting: Internal Medicine

## 2017-08-16 DIAGNOSIS — Q272 Other congenital malformations of renal artery: Secondary | ICD-10-CM | POA: Insufficient documentation

## 2017-09-17 ENCOUNTER — Other Ambulatory Visit: Payer: Self-pay | Admitting: Internal Medicine

## 2017-09-29 ENCOUNTER — Ambulatory Visit: Payer: 59 | Admitting: Family

## 2017-09-29 ENCOUNTER — Encounter: Payer: Self-pay | Admitting: Family

## 2017-09-29 VITALS — BP 110/80 | HR 63 | Temp 97.8°F | Ht 65.0 in | Wt 159.1 lb

## 2017-09-29 DIAGNOSIS — R6889 Other general symptoms and signs: Secondary | ICD-10-CM | POA: Diagnosis not present

## 2017-09-29 LAB — POC INFLUENZA A&B (BINAX/QUICKVUE)
INFLUENZA A, POC: NEGATIVE
INFLUENZA B, POC: NEGATIVE

## 2017-09-29 MED ORDER — OSELTAMIVIR PHOSPHATE 75 MG PO CAPS
75.0000 mg | ORAL_CAPSULE | Freq: Two times a day (BID) | ORAL | 0 refills | Status: DC
Start: 2017-09-29 — End: 2018-08-29

## 2017-09-29 NOTE — Patient Instructions (Signed)

## 2017-09-29 NOTE — Progress Notes (Signed)
Angelica [name] is a 32 y.o. female with the following history as recorded in Epic[employer]:  Patient Active Problem List   Diagnosis Date Noted  . 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 Tracheoesophageal fistula ([place])   . Elevated serum creatinine 01/25/2017  . HELLP syndrome 12/30/2016  . Cesarean delivery delivered - severe PEC, 25 wks, classic incision 10/27 12/28/2016  . Severe preeclampsia 12/26/2016  . Chronic hypertension with exacerbation during pregnancy in second trimester 12/25/2016  . Hypertension 04/13/2016  . Echocardiogram shows left ventricular diastolic dysfunction 03/54/6568  . Immune to hepatitis B 02/14/2015  . Anxiety and depression 02/14/2013  . ADD (attention deficit disorder) 02/14/2013  . Allergic rhinitis 02/14/2013    Current Outpatient Medications  Medication Sig Dispense Refill  . aspirin 325 MG tablet Take 325 mg by mouth daily.    . busPIRone (BUSPAR) 15 MG tablet Take 15 mg by mouth 3 (three) times daily.    Marland Kitchen Dexmethylphenidate HCl (FOCALIN PO) Take by mouth.    . EPINEPHrine 0.3 mg/0.3 mL IJ SOAJ injection INJECT 0.3MLS INTO THE MUSCLE ONCE  5  . labetalol (NORMODYNE) 200 MG tablet TAKE 2 TABLETS BY MOUTH EVERY 8 HOURS. 540 tablet 3  . loratadine (CLARITIN) 10 MG tablet Take 10 mg by mouth daily as needed for allergies or rhinitis.    Marland Kitchen LORazepam (ATIVAN) 0.5 MG tablet Take 1 tablet (0.5 mg total) by mouth every 6 (six) hours as needed for anxiety. 20 tablet 0  . Prenat w/o A-FE-Methfol-FA-DHA (PNV-DHA) 27-0.6-0.4-300 MG CAPS Take by mouth.    . sertraline (ZOLOFT) 100 MG tablet Take 2 tablets (200 mg total) by mouth daily. 60 tablet 2  . oseltamivir (TAMIFLU) 75 MG capsule Take 1 capsule (75 mg total) by mouth 2 (two) times daily. 10 capsule 0   No current facility-administered medications for this visit.     Allergies: Latex; Avocado; Banana; and Kiwi extract  Past Medical History:  Diagnosis Date  . ADHD   . Allergy   . Anxiety   . Chronic  hypertension   . Depression   . HELLP syndrome 2018  . Pregnancy induced hypertension   . Tracheoesophageal fistula [facility])     Past Surgical History:  Procedure Laterality Date  . CESAREAN SECTION N/A 12/27/2016   Procedure: CESAREAN SECTION;  Surgeon: Aloha Gell, MD;  Location: [place];  Service: Obstetrics;  Laterality: N/A;  . DILATION AND CURETTAGE OF UTERUS  01/2013  . ESOPHAGUS SURGERY    . TRACHEAL SURGERY    . TRACHEOESOPHAGEAL FISTULA REPAIR    . VASCULAR RING REPAIR      Family History  Problem Relation Age of Onset  . Diabetes Maternal Grandfather   . Stroke Paternal Grandmother   . Hyperlipidemia Paternal Grandmother   . Mental illness Mother   . Mental illness Sister   . Cancer Maternal Grandmother     Social History   Tobacco Use  . Smoking status: Former Research scientist (life sciences)  . Smokeless tobacco: Never Used  Substance Use Topics  . 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  Head: Normocephalic and atraumatic  Eyes: Sclera and conjunctiva clear; pupils round and reactive to light; extraocular movements intact  Ears: External normal; canals clear; tympanic membranes normal  Oropharynx: Pink, supple. No suspicious lesions  Neck: Supple without thyromegaly, adenopathy  Lungs: Respirations unlabored; clear to auscultation bilaterally without wheeze, rales, rhonchi  CVS exam: normal  rate and regular rhythm.  Abdomen: Soft; nontender; nondistended; normoactive bowel sounds; no masses or hepatosplenomegaly  Neurologic: Alert and oriented; speech intact; face symmetrical; moves all extremities well; CNII-XII intact without focal deficit  Assessment:  1. Flu-like symptoms     Plan:  Rapid flu is negative; reassurance- symptomatic treatment discussed; increased fluids, rest; recommend to stay out of work for at least 24 more hours; If no resolution of symptoms by tomorrow, okay to start Tamiflu;   No follow-ups on file.  Orders Placed This Encounter  Procedures  . POC Influenza A&B(BINAX/QUICKVUE)    Requested Prescriptions   Signed Prescriptions Disp Refills  . oseltamivir (TAMIFLU) 75 MG capsule 10 capsule 0    Sig: Take 1 capsule (75 mg total) by mouth 2 (two) times daily.

## 2017-11-30 DIAGNOSIS — I129 Hypertensive chronic kidney disease with stage 1 through stage 4 chronic kidney disease, or unspecified chronic kidney disease: Secondary | ICD-10-CM | POA: Diagnosis not present

## 2017-11-30 DIAGNOSIS — Z23 Encounter for immunization: Secondary | ICD-10-CM | POA: Diagnosis not present

## 2017-11-30 DIAGNOSIS — O1412 Severe pre-eclampsia, second trimester: Secondary | ICD-10-CM | POA: Diagnosis not present

## 2017-11-30 DIAGNOSIS — O1420 HELLP syndrome (HELLP), unspecified trimester: Secondary | ICD-10-CM | POA: Diagnosis not present

## 2018-01-01 ENCOUNTER — Ambulatory Visit: Payer: 59 | Admitting: Family Medicine

## 2018-01-01 DIAGNOSIS — J111 Influenza due to unidentified influenza virus with other respiratory manifestations: Secondary | ICD-10-CM | POA: Diagnosis not present

## 2018-01-01 NOTE — Progress Notes (Deleted)
Angelica [name] [facility] - 32 y.o. female MRN [id number]  Date of birth: Jan 26, 1986  SUBJECTIVE:  Including CC & ROS.  No chief complaint on file.   Angelica [name] is a 32 y.o. female that is  ***.  ***   Review of Systems  HISTORY: Past Medical, Surgical, Social, and Family History Reviewed & Updated per EMR.   Pertinent Historical Findings include:  Past Medical History:  Diagnosis Date  . ADHD   . Allergy   . Anxiety   . Chronic hypertension   . Depression   . HELLP syndrome 2018  . Pregnancy induced hypertension   . Tracheoesophageal fistula [facility])     Past Surgical History:  Procedure Laterality Date  . CESAREAN SECTION N/A 12/27/2016   Procedure: CESAREAN SECTION;  Surgeon: Aloha Gell, MD;  Location: [place];  Service: Obstetrics;  Laterality: N/A;  . DILATION AND CURETTAGE OF UTERUS  01/2013  . ESOPHAGUS SURGERY    . TRACHEAL SURGERY    . TRACHEOESOPHAGEAL FISTULA REPAIR    . VASCULAR RING REPAIR      Allergies  Allergen Reactions  . Latex Anaphylaxis and Hives  . Avocado     Sensitivity, not sure  . Banana Other (See Comments)    Sensitivity, not sure  . Kiwi Extract     Sensitivity, not sure    Family History  Problem Relation Age of Onset  . Diabetes Maternal Grandfather   . Stroke Paternal Grandmother   . Hyperlipidemia Paternal Grandmother   . Mental illness Mother   . Mental illness Sister   . Cancer Maternal Grandmother      Social History   Socioeconomic History  . Marital status: Married    Spouse name: n/a  . Number of children: 0  . Years of education: Master's  . Highest education level: Not on file  Occupational History  . 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 Occupation: [employer]    Comment: Biomedical engineer  Social Needs  . Financial resource strain: Not on file  . Food insecurity:    Worry: Not on file    Inability: Not on file   . Transportation needs:    Medical: Not on file    Non-medical: Not on file  Tobacco Use  . Smoking status: Former Research scientist (life sciences)  . Smokeless tobacco: Never Used  Substance and Sexual Activity  . Alcohol use: Yes    Alcohol/week: 5.0 - 10.0 standard drinks    Types: 5 - 10 Standard drinks or equivalent per week  . Drug use: No  . Sexual activity: Yes    Partners: Male  Lifestyle  . Physical activity:    Days per week: Not on file    Minutes per session: Not on file  . Stress: Not on file  Relationships  . Social connections:    Talks on phone: Not on file    Gets together: Not on file    Attends religious service: Not on file    Active member of club or organization: Not on file    Attends meetings of clubs or organizations: Not on file    Relationship status: Not on file  . Intimate partner violence:    Fear of current or ex partner: Not on file    Emotionally abused: Not on file    Physically abused: Not on file    Forced sexual activity: Not on file  Other Topics Concern  . Not on file  Social History Narrative   Graduated from [employer] 2008.   Lives with her fiance.   Engaged to be married in May 2017.        PHYSICAL EXAM:  VS: There were no vitals taken for this visit. Physical Exam Gen: NAD, alert, cooperative with exam, well-appearing ENT: normal lips, normal nasal mucosa,  Eye: normal EOM, normal conjunctiva and lids CV:  no edema, +2 pedal pulses   Resp: no accessory muscle use, non-labored,  GI: no masses or tenderness, no hernia  Skin: no rashes, no areas of induration  Neuro: normal tone, normal sensation to touch Psych:  normal insight, alert and oriented MSK:  ***      ASSESSMENT & PLAN:   No problem-specific Assessment & Plan notes found for this encounter.   The above documentation has been reviewed and is accurate and complete. Clearance Coots, MD 01/01/2018, 3:39 PM>

## 2018-01-06 DIAGNOSIS — E7212 Methylenetetrahydrofolate reductase deficiency: Secondary | ICD-10-CM | POA: Diagnosis not present

## 2018-01-06 DIAGNOSIS — Z3009 Encounter for other general counseling and advice on contraception: Secondary | ICD-10-CM | POA: Diagnosis not present

## 2018-01-06 DIAGNOSIS — Z3169 Encounter for other general counseling and advice on procreation: Secondary | ICD-10-CM | POA: Diagnosis not present

## 2018-06-05 ENCOUNTER — Encounter: Payer: Self-pay | Admitting: Internal Medicine

## 2018-06-06 MED ORDER — EPINEPHRINE 0.3 MG/0.3ML IJ SOAJ: INTRAMUSCULAR | 5 refills | Status: AC

## 2018-08-27 ENCOUNTER — Other Ambulatory Visit: Payer: Self-pay | Admitting: Internal Medicine

## 2018-08-27 ENCOUNTER — Telehealth: Payer: Self-pay | Admitting: Internal Medicine

## 2018-08-27 NOTE — Telephone Encounter (Signed)
Appointment has been made for Monday 6/29.

## 2018-08-27 NOTE — Telephone Encounter (Signed)
Medication Refill - Medication: labetalol (NORMODYNE) 200 MG tablet  Has the patient contacted their pharmacy? yes (Agent: If no, request that the patient contact the pharmacy for the refill.) (Agent: If yes, when and what did the pharmacy advise?) prescription has expired  Preferred Pharmacy (with phone number or street name): [facility]  Agent: Please be advised that RX refills may take up to 3 business days. We ask that you follow-up with your pharmacy.

## 2018-08-29 NOTE — Progress Notes (Signed)
Subjective:    Patient ID: Angelica [name], female    DOB: August 14, 1985, 33 y.o.   MRN: [id number]  HPI She is here for a physical exam.   Right foot pain: She has been experiencing pain in her right foot when she runs.  She also notices that her toes get numb.  She did sprain her foot years ago and wonders if that is the cause.    She is anxious and crying right now because of her concern about trying to get pregnant.  Overall her stress level on a daily basis is better, but she feels very anxious, frustrated and sad when she does not get pregnant.  Her and her husband have been trying to get pregnant for 8 months.  Her gynecologist tells her not to be concerned unless its more than a year.  She has been pregnant twice in the past and she got pregnant very quickly.  She is concerned about why she is not getting pregnant and if there is a problem.   Medications and allergies reviewed with patient and updated if appropriate.  Patient Active Problem List   Diagnosis Date Noted  . 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 Tracheoesophageal fistula ([place])   . Elevated serum creatinine 01/25/2017  . HELLP syndrome 12/30/2016  . Cesarean delivery delivered - severe PEC, 25 wks, classic incision 10/27 12/28/2016  . Severe preeclampsia 12/26/2016  . Chronic hypertension with exacerbation during pregnancy in second trimester 12/25/2016  . Hypertension 04/13/2016  . Echocardiogram shows left ventricular diastolic dysfunction 17/49/4496  . Immune to hepatitis B 02/14/2015  . Anxiety and depression 02/14/2013  . ADD (attention deficit disorder) 02/14/2013  . Allergic rhinitis 02/14/2013    Current Outpatient Medications on File Prior to Visit  Medication Sig Dispense Refill  . aspirin 325 MG tablet Take 325 mg by mouth daily.    Marland Kitchen buPROPion (WELLBUTRIN XL) 300 MG 24 hr tablet Take 300 mg by mouth daily.    . busPIRone (BUSPAR) 15 MG tablet Take 15 mg by mouth 3 (three) times daily.    Marland Kitchen  EPINEPHrine 0.3 mg/0.3 mL IJ SOAJ injection INJECT 0.3MLS INTO THE MUSCLE ONCE 1 Device 5  . folic acid (FOLATE) 759 MCG tablet Take 400 mcg by mouth daily.    Marland Kitchen loratadine (CLARITIN) 10 MG tablet Take 10 mg by mouth daily as needed for allergies or rhinitis.    Marland Kitchen LORazepam (ATIVAN) 0.5 MG tablet Take 1 tablet (0.5 mg total) by mouth every 6 (six) hours as needed for anxiety. 20 tablet 0  . Prenat w/o A-FE-Methfol-FA-DHA (PNV-DHA) 27-0.6-0.4-300 MG CAPS Take by mouth.    . sertraline (ZOLOFT) 100 MG tablet Take 2 tablets (200 mg total) by mouth daily. 60 tablet 2   No current facility-administered medications on file prior to visit.     Past Medical History:  Diagnosis Date  . ADHD   . Allergy   . Anxiety   . Chronic hypertension   . Depression   . HELLP syndrome 2018  . Pregnancy induced hypertension   . Tracheoesophageal fistula [facility])     Past Surgical History:  Procedure Laterality Date  . CESAREAN SECTION N/A 12/27/2016   Procedure: CESAREAN SECTION;  Surgeon: Aloha Gell, MD;  Location: [place];  Service: Obstetrics;  Laterality: N/A;  . DILATION AND CURETTAGE OF UTERUS  01/2013  . ESOPHAGUS SURGERY    . TRACHEAL SURGERY    . TRACHEOESOPHAGEAL FISTULA REPAIR    . VASCULAR  RING REPAIR      Social History   Socioeconomic History  . Marital status: Married    Spouse name: n/a  . Number of children: 0  . Years of education: Master's  . Highest education level: Not on file  Occupational History  . 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 Occupation: [employer]    Comment: Biomedical engineer  Social Needs  . Financial resource strain: Not on file  . Food insecurity    Worry: Not on file    Inability: Not on file  . Transportation needs    Medical: Not on file    Non-medical: Not on file  Tobacco Use  . Smoking status: Former Research scientist (life sciences)  . Smokeless tobacco: Never Used  Substance and  Sexual Activity  . Alcohol use: Yes    Alcohol/week: 5.0 - 10.0 standard drinks    Types: 5 - 10 Standard drinks or equivalent per week  . Drug use: No  . Sexual activity: Yes    Partners: Male  Lifestyle  . Physical activity    Days per week: Not on file    Minutes per session: Not on file  . Stress: Not on file  Relationships  . Social Herbalist on phone: Not on file    Gets together: Not on file    Attends religious service: Not on file    Active member of club or organization: Not on file    Attends meetings of clubs or organizations: Not on file    Relationship status: Not on file  Other Topics Concern  . Not on file  Social History Narrative   Graduated from [employer] 2008.   Lives with her fiance.   Engaged to be married in May 2017.       Family History  Problem Relation Age of Onset  . Diabetes Maternal Grandfather   . Stroke Paternal Grandmother   . Hyperlipidemia Paternal Grandmother   . Mental illness Mother   . Mental illness Sister   . Cancer Maternal Grandmother     Review of Systems  Constitutional: Negative for chills, fatigue and fever.  Eyes: Negative for visual disturbance.  Respiratory: Negative for cough, shortness of breath and wheezing.   Cardiovascular: Negative for chest pain, palpitations and leg swelling.  Gastrointestinal: Negative for abdominal pain, blood in stool, constipation and diarrhea.  Genitourinary: Negative for dysuria and hematuria.  Musculoskeletal: Negative for back pain.       Foot pain on left  Skin: Negative for color change and rash.       Moles - no change  Neurological: Negative for dizziness, light-headedness and headaches.  Psychiatric/Behavioral: Positive for dysphoric mood. The patient is nervous/anxious.        Objective:   Vitals:   08/30/18 1603  BP: 112/70  Pulse: 71  Resp: 16  Temp: 99.3 F (37.4 C)  SpO2: 99%   Filed Weights   08/30/18 1603  Weight: 150 lb 12.8 oz (68.4 kg)    Body mass index is 25.09 kg/m.  BP Readings from Last 3 Encounters:  08/30/18 112/70  09/29/17 110/80  08/13/17 130/84    Wt Readings from Last 3 Encounters:  08/30/18 150 lb 12.8 oz (68.4 kg)  09/29/17 159 lb 1.3 oz (72.2 kg)  08/13/17 162 lb (73.5 kg)     Physical Exam Constitutional: She appears well-developed and well-nourished. No distress.  HENT:  Head: Normocephalic and atraumatic.  Right Ear: External ear normal. Normal ear canal and TM Left Ear: External ear normal.  Normal ear canal and TM Mouth/Throat: Oropharynx is clear and moist.  Eyes: Conjunctivae and EOM are normal.  Neck: Neck supple. No tracheal deviation present. No thyromegaly present.  No carotid bruit  Cardiovascular: Normal rate, regular rhythm and normal heart sounds.   No murmur heard.  No edema. Pulmonary/Chest: Effort normal and breath sounds normal. No respiratory distress. She has no wheezes. She has no rales.  Breast: deferred   Abdominal: Soft. She exhibits no distension. There is no tenderness.  Lymphadenopathy: She has no cervical adenopathy.  Skin: Skin is warm and dry. She is not diaphoretic.  Psychiatric: She has a anxious mood and affect. Her behavior is normal.        Assessment & Plan:   Physical exam: Screening blood work ordered Immunizations    Up to date  Gyn  Up to date  Exercise running Weight   normal weight Skin    was concerned about a couple of moles-no change and they do appear normal-reassured Substance abuse   None    See Problem List for Assessment and Plan of chronic medical problems.   Follow-up in 1 year

## 2018-08-29 NOTE — Patient Instructions (Addendum)
Tests ordered today. Your results will be released to [place] (or called to you) after review, usually within 72hours after test completion. If any changes need to be made, you will be notified at that same time.  All other Health Maintenance issues reviewed.   All recommended immunizations and age-appropriate screenings are up-to-date or discussed.  No immunizations administered today.   Medications reviewed and updated.  Changes include :   Try labetalol twice a day  Your prescription(s) have been submitted to your pharmacy. Please take as directed and contact our office if you believe you are having problem(s) with the medication(s).   Please followup in one year   Health Maintenance, Female Adopting a healthy lifestyle and getting preventive care are important in promoting health and wellness. Ask your health care provider about:  The right schedule for you to have regular tests and exams.  Things you can do on your own to prevent diseases and keep yourself healthy. What should I know about diet, weight, and exercise? Eat a healthy diet   Eat a diet that includes plenty of vegetables, fruits, low-fat dairy products, and lean protein.  Do not eat a lot of foods that are high in solid fats, added sugars, or sodium. Maintain a healthy weight Body mass index (BMI) is used to identify weight problems. It estimates body fat based on height and weight. Your health care provider can help determine your BMI and help you achieve or maintain a healthy weight. Get regular exercise Get regular exercise. This is one of the most important things you can do for your health. Most adults should:  Exercise for at least 150 minutes each week. The exercise should increase your heart rate and make you sweat (moderate-intensity exercise).  Do strengthening exercises at least twice a week. This is in addition to the moderate-intensity exercise.  Spend less time sitting. Even light physical activity  can be beneficial. Watch cholesterol and blood lipids Have your blood tested for lipids and cholesterol at 34 years of age, then have this test every 5 years. Have your cholesterol levels checked more often if:  Your lipid or cholesterol levels are high.  You are older than 34 years of age.  You are at high risk for heart disease. What should I know about cancer screening? Depending on your health history and family history, you may need to have cancer screening at various ages. This may include screening for:  Breast cancer.  Cervical cancer.  Colorectal cancer.  Skin cancer.  Lung cancer. What should I know about heart disease, diabetes, and high blood pressure? Blood pressure and heart disease  High blood pressure causes heart disease and increases the risk of stroke. This is more likely to develop in people who have high blood pressure readings, are of African descent, or are overweight.  Have your blood pressure checked: ? Every 3-5 years if you are 62-22 years of age. ? Every year if you are 66 years old or older. Diabetes Have regular diabetes screenings. This checks your fasting blood sugar level. Have the screening done:  Once every three years after age 1 if you are at a normal weight and have a low risk for diabetes.  More often and at a younger age if you are overweight or have a high risk for diabetes. What should I know about preventing infection? Hepatitis B If you have a higher risk for hepatitis B, you should be screened for this virus. Talk with your health care provider  to find out if you are at risk for hepatitis B infection. Hepatitis C Testing is recommended for:  Everyone born from [birth year] through 1966.  Anyone with known risk factors for hepatitis C. Sexually transmitted infections (STIs)  Get screened for STIs, including gonorrhea and chlamydia, if: ? You are sexually active and are younger than 34 years of age. ? You are older than 33 years of  age and your health care provider tells you that you are at risk for this type of infection. ? Your sexual activity has changed since you were last screened, and you are at increased risk for chlamydia or gonorrhea. Ask your health care provider if you are at risk.  Ask your health care provider about whether you are at high risk for HIV. Your health care provider may recommend a prescription medicine to help prevent HIV infection. If you choose to take medicine to prevent HIV, you should first get tested for HIV. You should then be tested every 3 months for as long as you are taking the medicine. Pregnancy  If you are about to stop having your period (premenopausal) and you may become pregnant, seek counseling before you get pregnant.  Take 400 to 800 micrograms (mcg) of folic acid every day if you become pregnant.  Ask for birth control (contraception) if you want to prevent pregnancy. Osteoporosis and menopause Osteoporosis is a disease in which the bones lose minerals and strength with aging. This can result in bone fractures. If you are 54 years old or older, or if you are at risk for osteoporosis and fractures, ask your health care provider if you should:  Be screened for bone loss.  Take a calcium or vitamin D supplement to lower your risk of fractures.  Be given hormone replacement therapy (HRT) to treat symptoms of menopause. Follow these instructions at home: Lifestyle  Do not use any products that contain nicotine or tobacco, such as cigarettes, e-cigarettes, and chewing tobacco. If you need help quitting, ask your health care provider.  Do not use street drugs.  Do not share needles.  Ask your health care provider for help if you need support or information about quitting drugs. Alcohol use  Do not drink alcohol if: ? Your health care provider tells you not to drink. ? You are pregnant, may be pregnant, or are planning to become pregnant.  If you drink alcohol: ? Limit  how much you use to 0-1 drink a day. ? Limit intake if you are breastfeeding.  Be aware of how much alcohol is in your drink. In the [place] one drink equals one 12 oz bottle of beer (355 mL), one 5 oz glass of wine (148 mL), or one 1 oz glass of hard liquor (44 mL). General instructions  Schedule regular health, dental, and eye exams.  Stay current with your vaccines.  Tell your health care provider if: ? You often feel depressed. ? You have ever been abused or do not feel safe at home. Summary  Adopting a healthy lifestyle and getting preventive care are important in promoting health and wellness.  Follow your health care provider's instructions about healthy diet, exercising, and getting tested or screened for diseases.  Follow your health care provider's instructions on monitoring your cholesterol and blood pressure. This information is not intended to replace advice given to you by your health care provider. Make sure you discuss any questions you have with your health care provider. Document Released: 09/02/2010 Document Revised: 02/10/2018 Document Reviewed:  02/10/2018 Elsevier Patient Education  2020 [employer].

## 2018-08-30 ENCOUNTER — Ambulatory Visit (INDEPENDENT_AMBULATORY_CARE_PROVIDER_SITE_OTHER): Payer: 59 | Admitting: Internal Medicine

## 2018-08-30 ENCOUNTER — Encounter: Payer: Self-pay | Admitting: Internal Medicine

## 2018-08-30 ENCOUNTER — Other Ambulatory Visit: Payer: Self-pay

## 2018-08-30 VITALS — BP 112/70 | HR 71 | Temp 99.3°F | Resp 16 | Ht 65.0 in | Wt 150.8 lb

## 2018-08-30 DIAGNOSIS — F32A Depression, unspecified: Secondary | ICD-10-CM

## 2018-08-30 DIAGNOSIS — Z0000 Encounter for general adult medical examination without abnormal findings: Secondary | ICD-10-CM | POA: Diagnosis not present

## 2018-08-30 DIAGNOSIS — I10 Essential (primary) hypertension: Secondary | ICD-10-CM | POA: Diagnosis not present

## 2018-08-30 DIAGNOSIS — F419 Anxiety disorder, unspecified: Secondary | ICD-10-CM | POA: Diagnosis not present

## 2018-08-30 DIAGNOSIS — F329 Major depressive disorder, single episode, unspecified: Secondary | ICD-10-CM

## 2018-08-30 MED ORDER — LABETALOL HCL 200 MG PO TABS: ORAL_TABLET | ORAL | 8 refills | Status: DC

## 2018-08-30 NOTE — Assessment & Plan Note (Addendum)
Her blood pressure is very well controlled and she wonders if she can start taking the labetalol twice a day instead of 3 times a day.  She admits that sometimes she misses a dose She does monitor her blood pressure daily at home-she will do a trial of taking it only twice a day and will continue to monitor her pressure regularly-if it elevates at all she will go back to 3 times a day She will let me know if she has any questions or concerns CMP, TSH, CBC

## 2018-08-30 NOTE — Assessment & Plan Note (Signed)
Following with psychiatry-manage per psych Overall her anxiety and depression is well controlled-she just gets anxious and frustrated with trying to get pregnant and the disappointment of not getting pregnant

## 2018-09-07 ENCOUNTER — Ambulatory Visit (INDEPENDENT_AMBULATORY_CARE_PROVIDER_SITE_OTHER)
Admission: RE | Admit: 2018-09-07 | Discharge: 2018-09-07 | Disposition: A | Discharge status: Home / Self Care | Payer: 59 | Source: Ambulatory Visit | Attending: Family Medicine | Admitting: Family Medicine

## 2018-09-07 ENCOUNTER — Encounter: Payer: Self-pay | Admitting: Family Medicine

## 2018-09-07 ENCOUNTER — Ambulatory Visit: Payer: Self-pay

## 2018-09-07 ENCOUNTER — Ambulatory Visit: Payer: 59 | Admitting: Family Medicine

## 2018-09-07 ENCOUNTER — Other Ambulatory Visit: Payer: Self-pay

## 2018-09-07 VITALS — BP 102/72 | HR 75 | Ht 65.0 in | Wt 150.0 lb

## 2018-09-07 DIAGNOSIS — M79671 Pain in right foot: Secondary | ICD-10-CM | POA: Diagnosis not present

## 2018-09-07 DIAGNOSIS — M25371 Other instability, right ankle: Secondary | ICD-10-CM

## 2018-09-07 DIAGNOSIS — M216X9 Other acquired deformities of unspecified foot: Secondary | ICD-10-CM

## 2018-09-07 MED ORDER — PENNSAID 2 % TD SOLN: 2.0000 g | Freq: Two times a day (BID) | TRANSDERMAL | 3 refills | Status: DC

## 2018-09-07 MED ORDER — VITAMIN D (ERGOCALCIFEROL) 1.25 MG (50000 UNIT) PO CAPS: 50000.0000 [IU] | ORAL_CAPSULE | ORAL | 0 refills | Status: DC

## 2018-09-07 NOTE — Patient Instructions (Addendum)
Good to see you.  Ice 20 minutes 2 times daily. Usually after activity and before bed. Exercises 3 times a week.  pennsaid pinkie amount topically 2 times daily as needed.  Once weekly vitamin D for 12 weeks.   Spenco orthotics "total support" See me again in 4-6 weeks

## 2018-09-07 NOTE — Assessment & Plan Note (Signed)
Patient has what appears to be more of a right ankle instability with some mild abnormality noted of the lateral malleolus.  Concern for chronic avulsion fracture.  X-rays ordered today.  Patient was given an Aircast that she will need to wear if she is going to continue to do some of her exercise training.  We discussed low impact will be beneficial, once weekly vitamin D, patient also has significant breakdown of the longitudinal and transverse arch that is likely contributing to some of her aches and pains and we discussed over-the-counter orthotics, strengthening exercises and patient work with Product/process development scientist.  Discussed topical anti-inflammatories.  This was prescribed.  Follow-up again in 4 to 6 weeks

## 2018-09-07 NOTE — Progress Notes (Signed)
[facility] [address], [place] [address] Phone: [phone] Subjective:   I Angelica [name] am serving as a Education administrator for Dr. Hulan Saas. Referred by Dr. Quay Burow md  CC: Right foot pain  PTW:[id number]  Angelica [name] is a 33 y.o. female coming in with complaint of right foot pain. States 10 years ago she missed a step and rolled her ankle bad. Pain with exercising. Past year she has started exercising more. Sometimes bilaterally but worse on the right. The more she exercises she experiences numbness. Toe cracks sometimes.   Onset- 10 years ago Location - arch of her foot, tightness of the achilles  Character- achilles is tight ("muscle pulling in towards itself") Burning, arch of her foot feels like a stretching feeling Aggravating factors-certain movements and increasing activity Therapies tried- muscle massage post workout, foam rolling, heating pad Severity-6 out of 10     Past Medical History:  Diagnosis Date  . ADHD   . Allergy   . Anxiety   . Chronic hypertension   . Depression   . HELLP syndrome 2018  . Pregnancy induced hypertension   . Tracheoesophageal fistula [facility])    Past Surgical History:  Procedure Laterality Date  . CESAREAN SECTION N/A 12/27/2016   Procedure: CESAREAN SECTION;  Surgeon: Aloha Gell, MD;  Location: [place];  Service: Obstetrics;  Laterality: N/A;  . DILATION AND CURETTAGE OF UTERUS  01/2013  . ESOPHAGUS SURGERY    . TRACHEAL SURGERY    . TRACHEOESOPHAGEAL FISTULA REPAIR    . VASCULAR RING REPAIR     Social History   Socioeconomic History  . Marital status: Married    Spouse name: n/a  . Number of children: 0  . Years of education: Master's  . Highest education level: Not on file  Occupational History  . 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 Occupation: [employer]    Comment: Biomedical engineer  Social  Needs  . Financial resource strain: Not on file  . Food insecurity    Worry: Not on file    Inability: Not on file  . Transportation needs    Medical: Not on file    Non-medical: Not on file  Tobacco Use  . Smoking status: Former Research scientist (life sciences)  . Smokeless tobacco: Never Used  Substance and Sexual Activity  . Alcohol use: Yes    Alcohol/week: 5.0 - 10.0 standard drinks    Types: 5 - 10 Standard drinks or equivalent per week  . Drug use: No  . Sexual activity: Yes    Partners: Male  Lifestyle  . Physical activity    Days per week: Not on file    Minutes per session: Not on file  . Stress: Not on file  Relationships  . Social Herbalist on phone: Not on file    Gets together: Not on file    Attends religious service: Not on file    Active member of club or organization: Not on file    Attends meetings of clubs or organizations: Not on file    Relationship status: Not on file  Other Topics Concern  . Not on file  Social History Narrative   Graduated from [employer] 2008.   Lives with her fiance.   Engaged to be married in May 2017.      Allergies  Allergen Reactions  . Latex  Anaphylaxis and Hives  . Avocado     Sensitivity, not sure  . Banana Other (See Comments)    Sensitivity, not sure  . Kiwi Extract     Sensitivity, not sure   Family History  Problem Relation Age of Onset  . Diabetes Maternal Grandfather   . Stroke Paternal Grandmother   . Hyperlipidemia Paternal Grandmother   . Mental illness Mother   . Mental illness Sister   . Cancer Maternal Grandmother      Current Outpatient Medications (Cardiovascular):  Marland Kitchen  EPINEPHrine 0.3 mg/0.3 mL IJ SOAJ injection, INJECT 0.3MLS INTO THE MUSCLE ONCE .  labetalol (NORMODYNE) 200 MG tablet, TAKE 2 TABLETS BY MOUTH EVERY 8 HOURS.  Current Outpatient Medications (Respiratory):  .  loratadine (CLARITIN) 10 MG tablet, Take 10 mg by mouth daily as needed for allergies or rhinitis.  Current Outpatient  Medications (Analgesics):  .  aspirin 325 MG tablet, Take 325 mg by mouth daily.  Current Outpatient Medications (Hematological):  .  folic acid (FOLATE) 627 MCG tablet, Take 400 mcg by mouth daily.  Current Outpatient Medications (Other):  Marland Kitchen  buPROPion (WELLBUTRIN XL) 300 MG 24 hr tablet, Take 300 mg by mouth daily. .  busPIRone (BUSPAR) 15 MG tablet, Take 15 mg by mouth 3 (three) times daily. Marland Kitchen  LORazepam (ATIVAN) 0.5 MG tablet, Take 1 tablet (0.5 mg total) by mouth every 6 (six) hours as needed for anxiety. Riley Nearing w/o A-FE-Methfol-FA-DHA (PNV-DHA) 27-0.6-0.4-300 MG CAPS, Take by mouth. .  sertraline (ZOLOFT) 100 MG tablet, Take 2 tablets (200 mg total) by mouth daily. .  Diclofenac Sodium (PENNSAID) 2 % SOLN, Place 2 g onto the skin 2 (two) times daily. .  Vitamin D, Ergocalciferol, (DRISDOL) 1.25 MG (50000 UT) CAPS capsule, Take 1 capsule (50,000 Units total) by mouth every 7 (seven) days.    Past medical history, social, surgical and family history all reviewed in electronic medical record.  No pertanent information unless stated regarding to the chief complaint.   Review of Systems:  No headache, visual changes, nausea, vomiting, diarrhea, constipation, dizziness, abdominal pain, skin rash, fevers, chills, night sweats, weight loss, swollen lymph nodes, body aches, joint swelling, muscle aches, chest pain, shortness of breath, mood changes.   Objective  Blood pressure 102/72, pulse 75, height 5\' 5"  (1.651 m), weight 150 lb (68 kg), SpO2 98 %, unknown if currently breastfeeding. Systems examined below as of    General: No apparent distress alert and oriented x3 mood and affect normal, dressed appropriately.  HEENT: Pupils equal, extraocular movements intact  Respiratory: Patient's speak in full sentences and does not appear short of breath  Cardiovascular: No lower extremity edema, non tender, no erythema  Skin: Warm dry intact with no signs of infection or rash on extremities  or on axial skeleton.  Abdomen: Soft nontender  Neuro: Cranial nerves II through XII are intact, neurovascularly intact in all extremities with 2+ DTRs and 2+ pulses.  Lymph: No lymphadenopathy of posterior or anterior cervical chain or axillae bilaterally.  Gait normal with good balance and coordination.  MSK:  Non tender with full range of motion and good stability and symmetric strength and tone of shoulders, elbows, wrist, hip, knee bilaterally.  Right ankle exam shows the patient has some instability noted with lateral translation.  Positive anterior drawer test.  With mild atypical features of the lateral malleolus on the right side compared to the left.  Overpronation of the hindfoot Patient is in severe breakdown the  transverse arch. Contralateral ankle unremarkable Limited musculoskeletal ultrasound was performed and interpreted by Lyndal Pulley  Limited ultrasound of patient's foot shows the patient does have chronic abnormality noted of the lateral malleolus seems to be more of an avulsion fracture or nonhealing fracture.  Patient's ATFL is not seen.  Mild lateral impingement of the ankle noted.   [id number]; 15 additional minutes spent for Therapeutic exercises as stated in above notes.  This included exercises focusing on stretching, strengthening, with significant focus on eccentric aspects.   Long term goals include an improvement in range of motion, strength, endurance as well as avoiding reinjury. Patient's frequency would include in 1-2 times a day, 3-5 times a week for a duration of 6-12 weeks. Ankle strengthening that included:  Basic range of motion exercises to allow proper full motion at ankle Stretching of the lower leg and hamstrings  Theraband exercises for the lower leg - inversion, eversion, dorsiflexion and plantarflexion each to be completed with a theraband Balance exercises to increase proprioception Weight bearing exercises to increase strength and balance  Proper  technique shown and discussed handout in great detail with ATC.  All questions were discussed and answered.    Impression and Recommendations:     This case required medical decision making of moderate complexity. The above documentation has been reviewed and is accurate and complete Lyndal Pulley, DO       Note: This dictation was prepared with Dragon dictation along with smaller phrase technology. Any transcriptional errors that result from this process are unintentional.

## 2018-09-07 NOTE — Assessment & Plan Note (Signed)
Regarding the transverse arch.  We discussed metatarsal pad and over-the-counter orthotics.  Discussed which activities to do which wants to avoid.  Follow-up again in 4 to 8 weeks

## 2018-09-17 ENCOUNTER — Telehealth: Payer: Self-pay

## 2018-09-17 NOTE — Telephone Encounter (Signed)
Talked to patient about questions she had about ankle exercises.

## 2018-10-04 LAB — OB RESULTS CONSOLE ANTIBODY SCREEN: Antibody Screen: NEGATIVE

## 2018-10-04 LAB — OB RESULTS CONSOLE ABO/RH: RH Type: NEGATIVE

## 2018-10-04 LAB — OB RESULTS CONSOLE HIV ANTIBODY (ROUTINE TESTING): HIV: NONREACTIVE

## 2018-10-04 LAB — OB RESULTS CONSOLE HEPATITIS B SURFACE ANTIGEN: Hepatitis B Surface Ag: NEGATIVE

## 2018-10-04 LAB — OB RESULTS CONSOLE RPR: RPR: NONREACTIVE

## 2018-10-04 LAB — OB RESULTS CONSOLE GC/CHLAMYDIA
Chlamydia: NEGATIVE
Gonorrhea: NEGATIVE

## 2018-10-04 LAB — OB RESULTS CONSOLE RUBELLA ANTIBODY, IGM: Rubella: IMMUNE

## 2018-10-16 NOTE — Progress Notes (Deleted)
[facility] [address] [place], [place] [address] Phone: [phone] Subjective:    I'm seeing this patient by the request  of:    CC: Ankle pain follow-up  [id number]  Angelica [name] is a 33 y.o. female coming in with complaint of ***  Onset-  Location Duration-  Character- Aggravating factors- Reliving factors-  Therapies tried-  Severity-     Past Medical History:  Diagnosis Date  . ADHD   . Allergy   . Anxiety   . Chronic hypertension   . Depression   . HELLP syndrome 2018  . Pregnancy induced hypertension   . Tracheoesophageal fistula [facility])    Past Surgical History:  Procedure Laterality Date  . CESAREAN SECTION N/A 12/27/2016   Procedure: CESAREAN SECTION;  Surgeon: Aloha Gell, MD;  Location: [place];  Service: Obstetrics;  Laterality: N/A;  . DILATION AND CURETTAGE OF UTERUS  01/2013  . ESOPHAGUS SURGERY    . TRACHEAL SURGERY    . TRACHEOESOPHAGEAL FISTULA REPAIR    . VASCULAR RING REPAIR     Social History   Socioeconomic History  . Marital status: Married    Spouse name: n/a  . Number of children: 0  . Years of education: Master's  . Highest education level: Not on file  Occupational History  . 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 Occupation: [employer]    Comment: Biomedical engineer  Social Needs  . Financial resource strain: Not on file  . Food insecurity    Worry: Not on file    Inability: Not on file  . Transportation needs    Medical: Not on file    Non-medical: Not on file  Tobacco Use  . Smoking status: Former Research scientist (life sciences)  . Smokeless tobacco: Never Used  Substance and Sexual Activity  . Alcohol use: Yes    Alcohol/week: 5.0 - 10.0 standard drinks    Types: 5 - 10 Standard drinks or equivalent per week  . Drug use: No  . Sexual activity: Yes    Partners: Male  Lifestyle  . Physical activity    Days  per week: Not on file    Minutes per session: Not on file  . Stress: Not on file  Relationships  . Social Herbalist on phone: Not on file    Gets together: Not on file    Attends religious service: Not on file    Active member of club or organization: Not on file    Attends meetings of clubs or organizations: Not on file    Relationship status: Not on file  Other Topics Concern  . Not on file  Social History Narrative   Graduated from [employer] 2008.   Lives with her fiance.   Engaged to be married in May 2017.      Allergies  Allergen Reactions  . Latex Anaphylaxis and Hives  . Avocado     Sensitivity, not sure  . Banana Other (See Comments)    Sensitivity, not sure  . Kiwi Extract     Sensitivity, not sure   Family History  Problem Relation Age of Onset  . Diabetes Maternal Grandfather   . Stroke Paternal Grandmother   . Hyperlipidemia Paternal Grandmother   . Mental illness Mother   . Mental illness Sister   . Cancer Maternal Grandmother      Current  Outpatient Medications (Cardiovascular):  Marland Kitchen  EPINEPHrine 0.3 mg/0.3 mL IJ SOAJ injection, INJECT 0.3MLS INTO THE MUSCLE ONCE .  labetalol (NORMODYNE) 200 MG tablet, TAKE 2 TABLETS BY MOUTH EVERY 8 HOURS.  Current Outpatient Medications (Respiratory):  .  loratadine (CLARITIN) 10 MG tablet, Take 10 mg by mouth daily as needed for allergies or rhinitis.  Current Outpatient Medications (Analgesics):  .  aspirin 325 MG tablet, Take 325 mg by mouth daily.  Current Outpatient Medications (Hematological):  .  folic acid (FOLATE) 188 MCG tablet, Take 400 mcg by mouth daily.  Current Outpatient Medications (Other):  Marland Kitchen  buPROPion (WELLBUTRIN XL) 300 MG 24 hr tablet, Take 300 mg by mouth daily. .  busPIRone (BUSPAR) 15 MG tablet, Take 15 mg by mouth 3 (three) times daily. .  Diclofenac Sodium (PENNSAID) 2 % SOLN, Place 2 g onto the skin 2 (two) times daily. Marland Kitchen  LORazepam (ATIVAN) 0.5 MG tablet, Take 1  tablet (0.5 mg total) by mouth every 6 (six) hours as needed for anxiety. Riley Nearing w/o A-FE-Methfol-FA-DHA (PNV-DHA) 27-0.6-0.4-300 MG CAPS, Take by mouth. .  sertraline (ZOLOFT) 100 MG tablet, Take 2 tablets (200 mg total) by mouth daily. .  Vitamin D, Ergocalciferol, (DRISDOL) 1.25 MG (50000 UT) CAPS capsule, Take 1 capsule (50,000 Units total) by mouth every 7 (seven) days.    Past medical history, social, surgical and family history all reviewed in electronic medical record.  No pertanent information unless stated regarding to the chief complaint.   Review of Systems:  No headache, visual changes, nausea, vomiting, diarrhea, constipation, dizziness, abdominal pain, skin rash, fevers, chills, night sweats, weight loss, swollen lymph nodes, body aches, joint swelling, muscle aches, chest pain, shortness of breath, mood changes.   Objective  unknown if currently breastfeeding. Systems examined below as of    General: No apparent distress alert and oriented x3 mood and affect normal, dressed appropriately.  HEENT: Pupils equal, extraocular movements intact  Respiratory: Patient's speak in full sentences and does not appear short of breath  Cardiovascular: No lower extremity edema, non tender, no erythema  Skin: Warm dry intact with no signs of infection or rash on extremities or on axial skeleton.  Abdomen: Soft nontender  Neuro: Cranial nerves II through XII are intact, neurovascularly intact in all extremities with 2+ DTRs and 2+ pulses.  Lymph: No lymphadenopathy of posterior or anterior cervical chain or axillae bilaterally.  Gait normal with good balance and coordination.  MSK:  Non tender with full range of motion and good stability and symmetric strength and tone of shoulders, elbows, wrist, hip, knee bilaterally.  Ankle: No visible erythema or swelling. Range of motion is full in all directions. Strength is 5/5 in all directions. Stable lateral and medial ligaments; squeeze  test and kleiger test unremarkable; Talar dome nontender; No pain at base of 5th MT; No tenderness over cuboid; No tenderness over N spot or navicular prominence No tenderness on posterior aspects of lateral and medial malleolus No sign of peroneal tendon subluxations or tenderness to palpation Negative tarsal tunnel tinel's Able to walk 4 steps.   Impression and Recommendations:     This case required medical decision making of moderate complexity. The above documentation has been reviewed and is accurate and complete Lyndal Pulley, DO       Note: This dictation was prepared with Dragon dictation along with smaller phrase technology. Any transcriptional errors that result from this process are unintentional.

## 2018-10-17 IMAGING — US US MFM UA CORD DOPPLER
2 series · 13 of 28 positions shown · non-contrast
Comparison: none

[Series 1: us mfm ua cord doppler · 4 of 32 slices shown (1 of 2)]
[im 4/32]
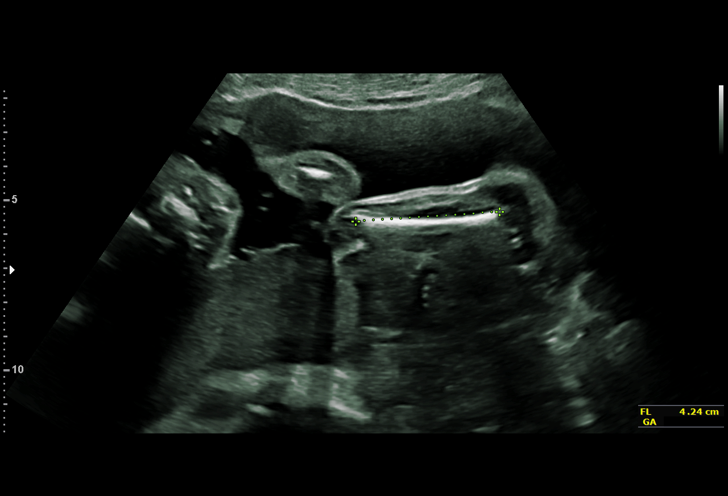
[im 12/32]
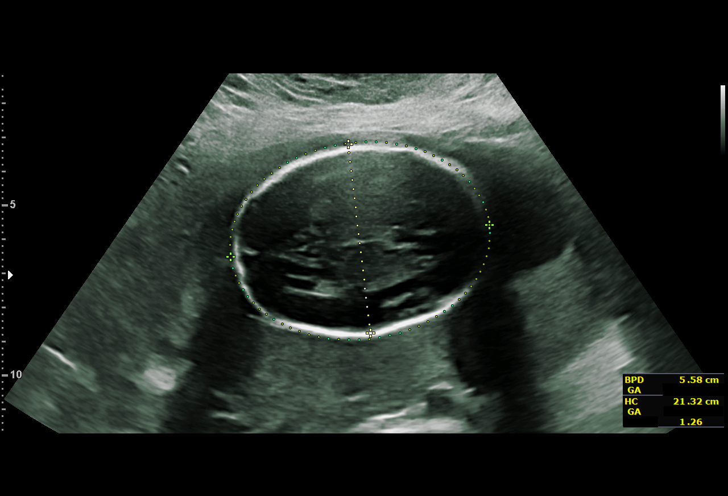
[im 20/32]
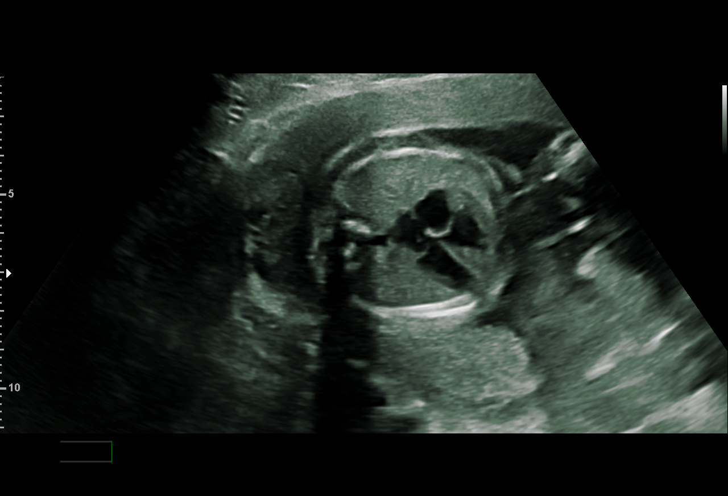
[im 28/32]
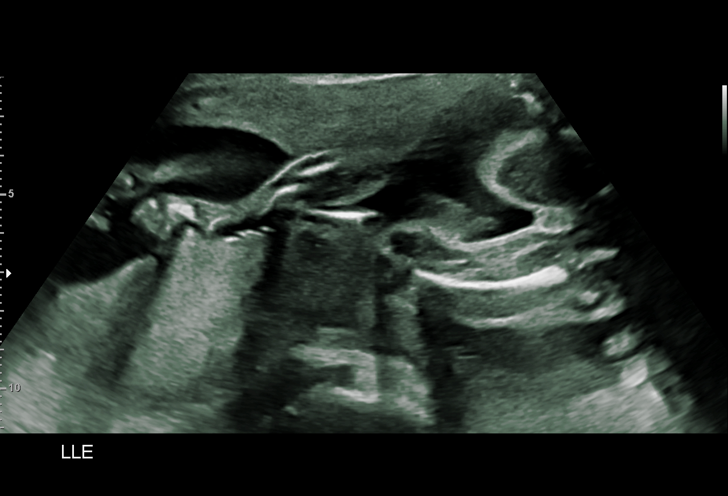

[Series 2: us mfm ua cord doppler · 9 of 69 slices shown (2 of 2)]
[im 1/69]
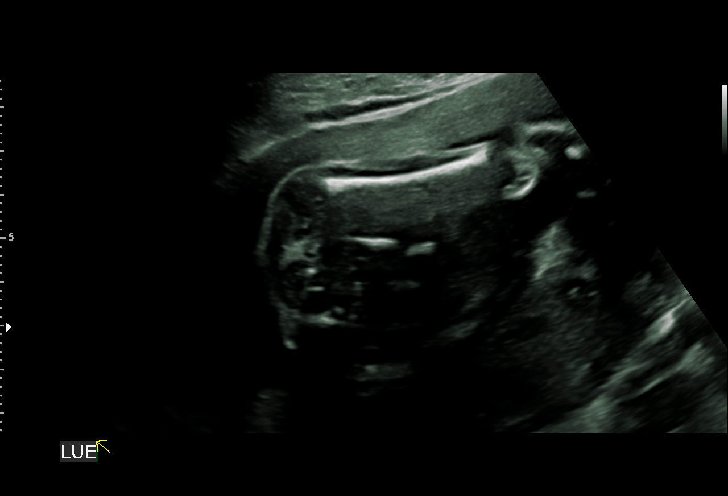
[im 8/69]
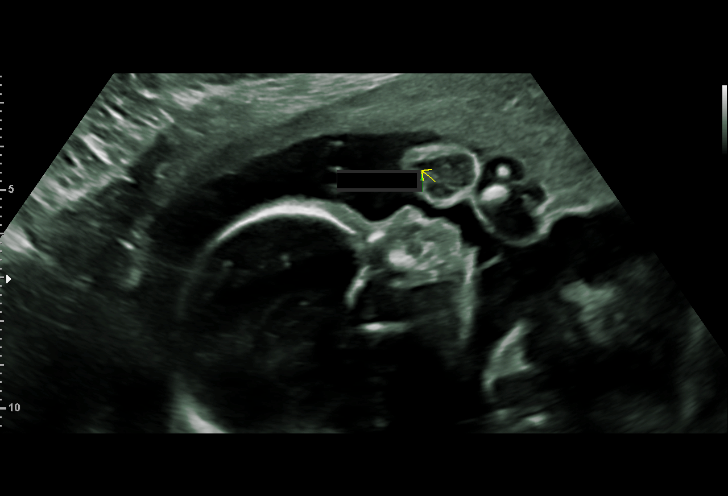
[im 19/69]
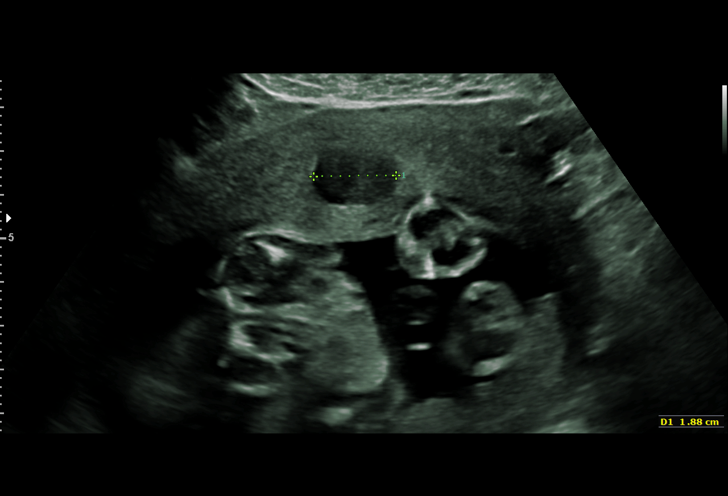
[im 27/69]
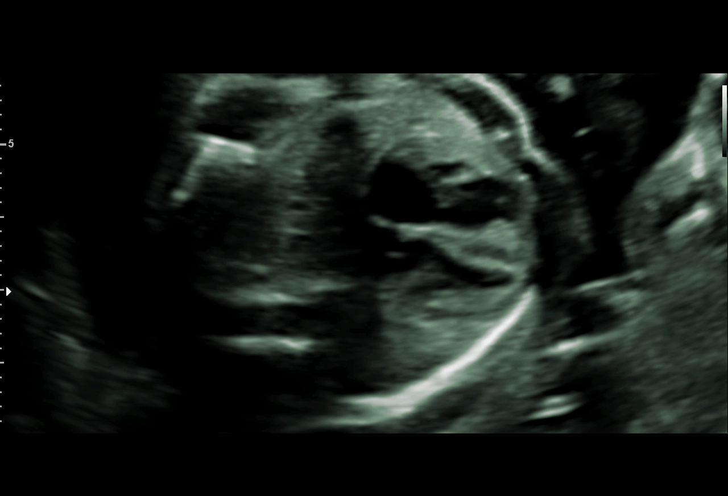
[im 35/69]
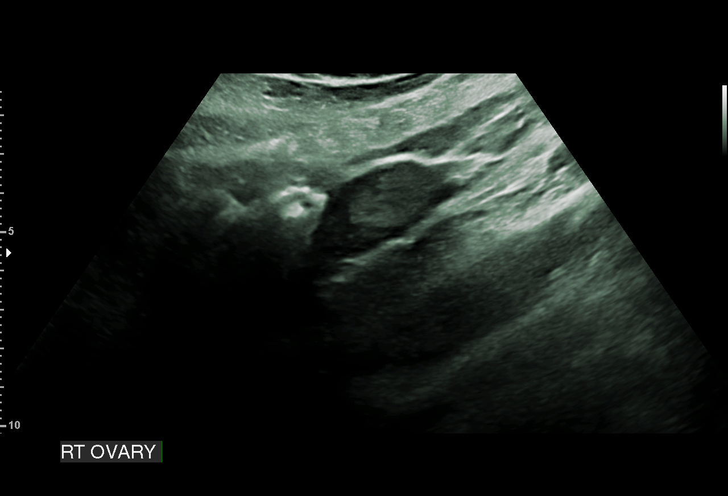
[im 42/69]
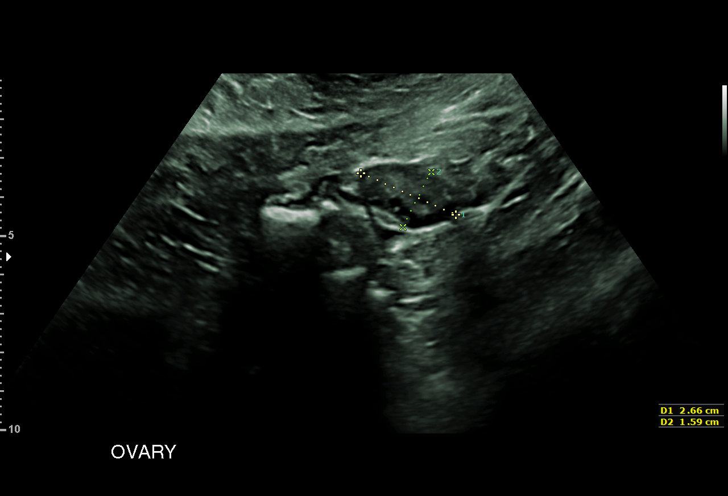
[im 50/69]
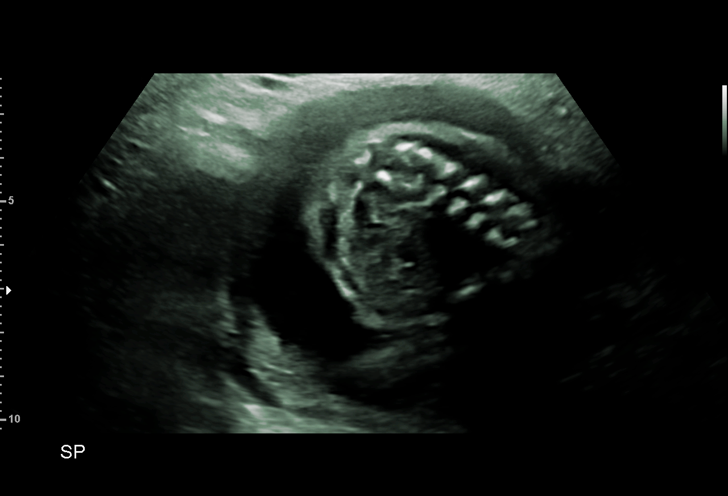
[im 57/69]
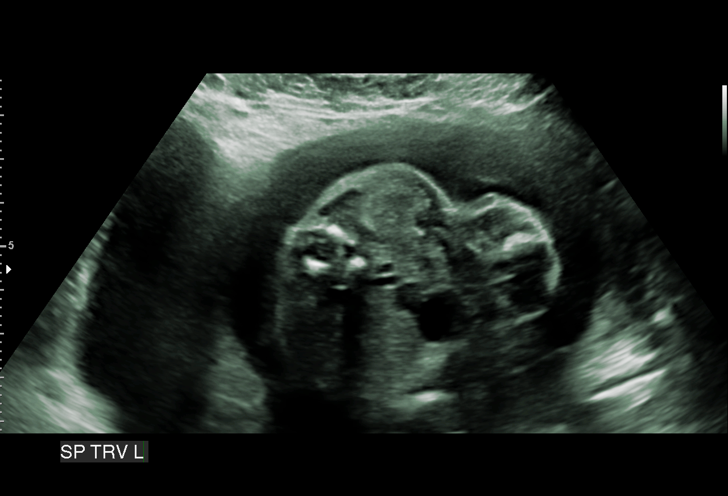
[im 65/69]
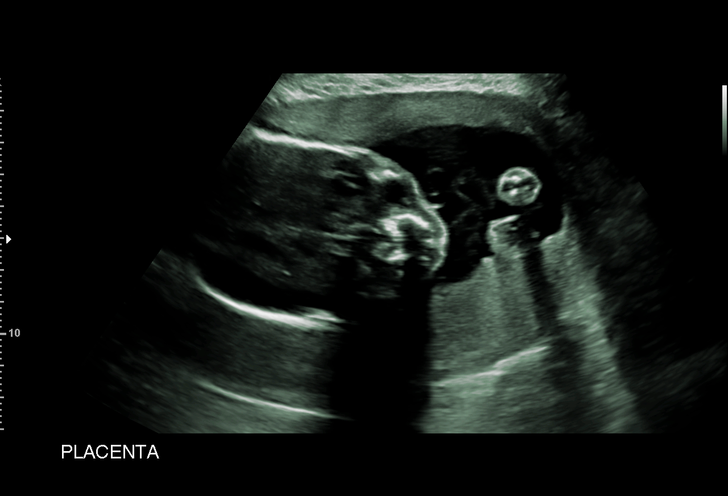

[13 of 28 positions shown; findings below may reference images not displayed]

OB/GYN &
Infertility
[id number] [HOSPITAL]
Attending:        Condratie Rashoeva         Secondary Phy.:   3rd Nursing- HR
OB

1  EROL RAINER           [id number]      [id number]     [id number]
2  EROL RAINER           [id number]      [id number]     [id number]
Indications

24 weeks gestation of pregnancy
Hypertension - Chronic/Pre-existing
Small for gestational age fetus affecting
management of mother
BMZ x 1 10-25
Proteinuria in pregnancy, antepartum
Elevated P/C ratio
OB History

Gravidity:    2         Term:   0         SAB:   1
Living:       0
Fetal Evaluation

Num Of Fetuses:     1
Fetal Heart         134
Rate(bpm):
Cardiac Activity:   Observed
Presentation:       Breech
Placenta:           Posterior, above cervical os

Amniotic Fluid
AFI FV:      Subjectively within normal limits
Largest Pocket(cm)
3.4
Biometry

BPD:      54.9  mm     G. Age:  22w 5d          2  %    CI:        67.28   %    70 - 86
FL/HC:      19.6   %    18.7 -
HC:      214.3  mm     G. Age:  23w 4d          4  %    HC/AC:      1.25        1.04 -
AC:      171.5  mm     G. Age:  22w 1d        < 3  %    FL/BPD:     76.7   %    71 - 87
FL:       42.1  mm     G. Age:  23w 5d         12  %    FL/AC:      24.5   %    20 - 24
HUM:      39.6  mm     G. Age:  24w 1d         29  %
CER:      25.9  mm     G. Age:  23w 6d         25  %

LV:        3.9  mm

Est. FW:     543  gm      1 lb 3 oz     18  %
Gestational Age

Clinical EDD:  24w 5d                                        EDD:   04/11/17
U/S Today:     23w 0d                                        EDD:   04/23/17
Best:          24w 5d     Det. By:  Clinical EDD             EDD:   04/11/17
Anatomy

Cranium:               Appears normal         Aortic Arch:            Not well visualized
Cavum:                 Appears normal         Ductal Arch:            Not well visualized
Ventricles:            Appears normal         Diaphragm:              Appears normal
Choroid Plexus:        Appears normal         Stomach:                Appears normal, left
sided
Cerebellum:            Appears normal         Abdomen:                Appears normal
Posterior Fossa:       Appears normal         Abdominal Wall:         Appears nml (cord
insert, abd wall)
Nuchal Fold:           Appears normal         Cord Vessels:           Appears normal (3
vessel cord)
Face:                  Appears normal         Kidneys:                Appear normal
(orbits and profile)
Lips:                  Appears normal         Bladder:                Appears normal
Thoracic:              Appears normal         Spine:                  Appears normal
Heart:                 Appears normal         Upper Extremities:      Appears normal
(4CH, axis, and
situs)
RVOT:                  Appears normal         Lower Extremities:      Appears normal
LVOT:                  Appears normal

Other:  Technically difficult due to fetal position. Female gender. Nasal bone
visualized. Heels and 5th digit visualized.
Doppler - Fetal Vessels

Umbilical Artery
S/D     %tile     RI              PI              PSV    ADFV    RDFV
(cm/s)
3.46       53   0.71             1.18             40.56      No      No

Cervix Uterus Adnexa

Cervix
Length:              4  cm.
Normal appearance by transabdominal scan.
Left Ovary
Within normal limits.

Right Ovary
Within normal limits.
Myomas

Site                     L(cm)      W(cm)      D(cm)      Location
Anterior                 1.7        1.4        1.9        Intramural

Blood Flow                 RI        PI       Comments

Impression

IUP at 24+5 weeks with severe preeclampsia
Normal fetal movement and cardiac activity
Normal placentation and amniotic fluid
Review of anatomy shows no gross structural defects
Fetal growth is decreased, with composite growth in the 18th
percentile and AC at <3rd percentile
Recommendations

Discussed case with Dr. Giorgi. MFM consult to follow

## 2018-10-18 ENCOUNTER — Ambulatory Visit (INDEPENDENT_AMBULATORY_CARE_PROVIDER_SITE_OTHER): Payer: 59 | Admitting: Family Medicine

## 2018-10-18 ENCOUNTER — Telehealth: Payer: Self-pay | Admitting: Internal Medicine

## 2018-10-18 ENCOUNTER — Other Ambulatory Visit: Payer: Self-pay

## 2018-10-18 ENCOUNTER — Encounter: Payer: Self-pay | Admitting: Family Medicine

## 2018-10-18 ENCOUNTER — Ambulatory Visit: Payer: 59 | Admitting: Family Medicine

## 2018-10-18 ENCOUNTER — Ambulatory Visit: Payer: Self-pay

## 2018-10-18 VITALS — BP 100/70 | HR 74 | Ht 65.0 in | Wt 147.0 lb

## 2018-10-18 DIAGNOSIS — G8929 Other chronic pain: Secondary | ICD-10-CM

## 2018-10-18 DIAGNOSIS — M216X9 Other acquired deformities of unspecified foot: Secondary | ICD-10-CM | POA: Diagnosis not present

## 2018-10-18 DIAGNOSIS — M25571 Pain in right ankle and joints of right foot: Secondary | ICD-10-CM

## 2018-10-18 NOTE — Telephone Encounter (Signed)
FU with pt - should have COVID testing if that is a possibility - I can order if needed

## 2018-10-18 NOTE — Assessment & Plan Note (Signed)
Discussed with patient again at great length.  Discussed which activities of doing which was to avoid.  Discussed importance during pregnancy to wear good shoes as well to stop the breakdown of the transverse arch more.  Follow-up again 4 to 8 weeks

## 2018-10-18 NOTE — Telephone Encounter (Signed)
LVM with response below. Also advised that she follow up with GYN.

## 2018-10-18 NOTE — Patient Instructions (Addendum)
Good to see you Ice 20 minutes 2 times daily. Usually after activity and before bed. Arnica lotion 2 times a day on the ankle [email or web address]  Hoka arahi and or Bondi 6 could be good shoes Congrats!!! See me again in 2 months if not perfect!

## 2018-10-18 NOTE — Telephone Encounter (Signed)
Patient called [facility] 10/15/18 at 8:32pm.  States she is [redacted] weeks pregnant and having flu like symptoms.  States she is fatigue and has body aches.  States that she feels worse when she is moving around and that she is having congestion.   Patient was given advise on how to care for herself at home.   Patient had appt scheduled for today 8/17 with sports med.  After consulting with sports med this appointment has been cancelled. I have tried to reach out to patient to notify of appt cancellation and to see if patient would like to schedule a virtual visit.  I had to leave a VM in regard.

## 2018-10-18 NOTE — Progress Notes (Signed)
[name] [address] [place], [place] [address] Phone: [phone] Subjective:   I Angelica [name] am serving as a Education administrator for Dr. Hulan Saas.  I'm seeing this patient by the request  of:    CC: Right foot pain follow-up  [id number]   09/07/2018 Patient has what appears to be more of a right ankle instability with some mild abnormality noted of the lateral malleolus.  Concern for chronic avulsion fracture.  X-rays ordered today.  Patient was given an Aircast that she will need to wear if she is going to continue to do some of her exercise training.  We discussed low impact will be beneficial, once weekly vitamin D, patient also has significant breakdown of the longitudinal and transverse arch that is likely contributing to some of her aches and pains and we discussed over-the-counter orthotics, strengthening exercises and patient work with Product/process development scientist.  Discussed topical anti-inflammatories.  This was prescribed.  Follow-up again in 4 to 6 weeks  10/18/2018 Angelica [name] is a 33 y.o. female coming in with complaint of right foot pain. States she is doing better. Less high impact. Still doing home exercises.  Patient's x-rays do show the patient had what appeared to be a small chronic avulsion fracture on the lateral joint line.  Patient still some is doing relatively better.  Patient is [redacted] weeks pregnant and likely will not start of any progression at this time.       Past Medical History:  Diagnosis Date   ADHD    Allergy    Anxiety    Chronic hypertension    Depression    HELLP syndrome 2018   Pregnancy induced hypertension    Tracheoesophageal fistula [facility])    Past Surgical History:  Procedure Laterality Date   CESAREAN SECTION N/A 12/27/2016   Procedure: CESAREAN SECTION;  Surgeon: Aloha Gell, MD;  Location: [place];  Service: Obstetrics;  Laterality: N/A;   DILATION AND CURETTAGE OF UTERUS  01/2013    ESOPHAGUS SURGERY     TRACHEAL SURGERY     TRACHEOESOPHAGEAL FISTULA REPAIR     VASCULAR RING REPAIR     Social History   Socioeconomic History   Marital status: Married    Spouse name: n/a   Number of children: 0   Years of education: Master's   Highest education level: Not on file  Occupational History   Occupation: Buyer, 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    Comment: [employer]   Occupation: [employer]    Comment: Optician, dispensing strain: Not on file   Food insecurity    Worry: Not on file    Inability: Not on file   Transportation needs    Medical: Not on file    Non-medical: Not on file  Tobacco Use   Smoking status: Former Smoker   Smokeless tobacco: Never Used  Substance and Sexual Activity   Alcohol use: Yes    Alcohol/week: 5.0 - 10.0 standard drinks    Types: 5 - 10 Standard drinks or equivalent per week   Drug use: No   Sexual activity: Yes    Partners: Male  Lifestyle   Physical activity    Days per week: Not on file    Minutes per session: Not on file   Stress: Not on file  Relationships   Social connections    Talks on phone: Not on file  Gets together: Not on file    Attends religious service: Not on file    Active member of club or organization: Not on file    Attends meetings of clubs or organizations: Not on file    Relationship status: Not on file  Other Topics Concern   Not on file  Social History Narrative   Graduated from [employer] 2008.   Lives with her fiance.   Engaged to be married in May 2017.      Allergies  Allergen Reactions   Latex Anaphylaxis and Hives   Avocado     Sensitivity, not sure   Banana Other (See Comments)    Sensitivity, not sure   Kiwi Extract     Sensitivity, not sure   Family History  Problem Relation Age of Onset   Diabetes Maternal Grandfather    Stroke Paternal Grandmother    Hyperlipidemia  Paternal Grandmother    Mental illness Mother    Mental illness Sister    Cancer Maternal Grandmother      Current Outpatient Medications (Cardiovascular):    EPINEPHrine 0.3 mg/0.3 mL IJ SOAJ injection, INJECT 0.3MLS INTO THE MUSCLE ONCE   labetalol (NORMODYNE) 200 MG tablet, TAKE 2 TABLETS BY MOUTH EVERY 8 HOURS.  Current Outpatient Medications (Respiratory):    loratadine (CLARITIN) 10 MG tablet, Take 10 mg by mouth daily as needed for allergies or rhinitis.  Current Outpatient Medications (Analgesics):    aspirin 325 MG tablet, Take 325 mg by mouth daily.  Current Outpatient Medications (Hematological):    folic acid (FOLATE) 696 MCG tablet, Take 400 mcg by mouth daily.  Current Outpatient Medications (Other):    buPROPion (WELLBUTRIN XL) 300 MG 24 hr tablet, Take 300 mg by mouth daily.   busPIRone (BUSPAR) 15 MG tablet, Take 15 mg by mouth 3 (three) times daily.   Diclofenac Sodium (PENNSAID) 2 % SOLN, Place 2 g onto the skin 2 (two) times daily.   LORazepam (ATIVAN) 0.5 MG tablet, Take 1 tablet (0.5 mg total) by mouth every 6 (six) hours as needed for anxiety.   Prenat w/o A-FE-Methfol-FA-DHA (PNV-DHA) 27-0.6-0.4-300 MG CAPS, Take by mouth.   sertraline (ZOLOFT) 100 MG tablet, Take 2 tablets (200 mg total) by mouth daily.   Vitamin D, Ergocalciferol, (DRISDOL) 1.25 MG (50000 UT) CAPS capsule, Take 1 capsule (50,000 Units total) by mouth every 7 (seven) days.    Past medical history, social, surgical and family history all reviewed in electronic medical record.  No pertanent information unless stated regarding to the chief complaint.   Review of Systems:  No headache, visual changes, nausea, vomiting, diarrhea, constipation, dizziness, abdominal pain, skin rash, fevers, chills, night sweats, weight loss, swollen lymph nodes, body aches, joint swelling, , chest pain, shortness of breath, mood changes.  Positive muscle aches  Objective  Blood pressure 100/70,  pulse 74, height 5\' 5"  (1.651 m), weight 147 lb (66.7 kg), SpO2 98 %, unknown if currently breastfeeding.    General: No apparent distress alert and oriented x3 mood and affect normal, dressed appropriately.  HEENT: Pupils equal, extraocular movements intact  Respiratory: Patient's speak in full sentences and does not appear short of breath  Cardiovascular: No lower extremity edema, non tender, no erythema  Skin: Warm dry intact with no signs of infection or rash on extremities or on axial skeleton.  Neuro: Cranial nerves II through XII are intact, neurovascularly intact in all extremities with 2+ DTRs and 2+ pulses.  Lymph: No lymphadenopathy of posterior or  anterior cervical chain or axillae bilaterally.  Gait normal with good balance and coordination.  MSK:  Non tender with full range of motion and good stability and symmetric strength and tone of shoulders, elbows, wrist, hip, knee and bilaterally.  Right ankle exam shows significant decrease in swelling from previous exam.  Improvement in range of motion still minorly tender to palpation over the lateral malleolus area.  No significant impingement noted today.  Neurovascular intact distally.  Breakdown the transverse arch is noted though.  Limited musculoskeletal ultrasound was performed and interpreted by Lyndal Pulley  Limited ultrasound still shows the patient has a calcific changes noted of the lateral ankle joint.  Patient still has a trace effusion of the ankle joint noted as well.  Impression: Continued effusion in the joint but improvement   Impression and Recommendations:     This case required medical decision making of moderate complexity. The above documentation has been reviewed and is accurate and complete Lyndal Pulley, DO       Note: This dictation was prepared with Dragon dictation along with smaller phrase technology. Any transcriptional errors that result from this process are unintentional.

## 2018-10-26 ENCOUNTER — Encounter (HOSPITAL_COMMUNITY): Payer: Self-pay

## 2018-11-05 ENCOUNTER — Encounter (HOSPITAL_COMMUNITY): Payer: Self-pay | Admitting: *Deleted

## 2018-11-10 ENCOUNTER — Ambulatory Visit (HOSPITAL_COMMUNITY): Payer: Self-pay

## 2018-11-11 ENCOUNTER — Other Ambulatory Visit: Payer: Self-pay

## 2018-11-11 ENCOUNTER — Ambulatory Visit (HOSPITAL_COMMUNITY): Payer: 59 | Attending: Obstetrics and Gynecology | Admitting: Obstetrics and Gynecology

## 2018-11-11 ENCOUNTER — Ambulatory Visit (HOSPITAL_COMMUNITY): Payer: 59 | Admitting: *Deleted

## 2018-11-11 VITALS — BP 108/78 | HR 78 | Temp 97.1°F

## 2018-11-11 DIAGNOSIS — O99341 Other mental disorders complicating pregnancy, first trimester: Secondary | ICD-10-CM | POA: Insufficient documentation

## 2018-11-11 DIAGNOSIS — Z7982 Long term (current) use of aspirin: Secondary | ICD-10-CM | POA: Diagnosis not present

## 2018-11-11 DIAGNOSIS — O10911 Unspecified pre-existing hypertension complicating pregnancy, first trimester: Secondary | ICD-10-CM | POA: Insufficient documentation

## 2018-11-11 DIAGNOSIS — F419 Anxiety disorder, unspecified: Secondary | ICD-10-CM | POA: Diagnosis not present

## 2018-11-11 DIAGNOSIS — O0990 Supervision of high risk pregnancy, unspecified, unspecified trimester: Secondary | ICD-10-CM

## 2018-11-11 DIAGNOSIS — Z3A09 9 weeks gestation of pregnancy: Secondary | ICD-10-CM | POA: Insufficient documentation

## 2018-11-11 DIAGNOSIS — F329 Major depressive disorder, single episode, unspecified: Secondary | ICD-10-CM | POA: Diagnosis not present

## 2018-11-11 DIAGNOSIS — O09299 Supervision of pregnancy with other poor reproductive or obstetric history, unspecified trimester: Secondary | ICD-10-CM

## 2018-11-11 DIAGNOSIS — Z79899 Other long term (current) drug therapy: Secondary | ICD-10-CM | POA: Diagnosis not present

## 2018-11-11 DIAGNOSIS — O09291 Supervision of pregnancy with other poor reproductive or obstetric history, first trimester: Secondary | ICD-10-CM | POA: Diagnosis not present

## 2018-11-11 NOTE — Consult Note (Signed)
Maternal-Fetal Medicine  Name: Angelica [name] MRN: [id number] Requesting Provider: Dr. Aloha Gell, MD  I had the pleasure of seeing Angelica [name] today at the [facility]. She was accompanied by her husband. She is a G3 P0110 at 9-weeks' gestation. She is here for MFM consultation because of her history of preeclampsia with severe features and neonatal demise.  In her previous pregnancy in October 2018 at 24w 5d gestation, the patient was admitted with increased blood pressures and headaches. Patient had chronic hypertension and recently, the dosage of labetalol was increased to 200 mg bid. On admission, the blood pressure was 168/108 mm Hg. Liver enzymes were mildly elevated and platelets were within normal limits. Patient received antenatal corticosteroids and magnesium sulfate prophylaxis.  MFM consultation (Dr. Lucia Gaskins) was obtained. On ultrasound, the estimated fetal weight was 543 grams  (18th percentile) and AC measurement at less than the 3rd percentile. Umbilical artery Doppler study showed normal forward diastolic flow.  Total protein (24 h) was increased (3,807 g).  Over the next few days, serum creatinine levels increased and a decision to deliver was made. Chart note also mentioned HELLP syndrome. Patient underwent classical cesarean delivery and a female infant was delivered. Unfortunately, the infant Angelica [name]) died at 51 hours after birth because of extreme prematurity and growth restriction. Patient was discharged on day 4. She was readmitted for evaluation about 10 days later with increased blood pressure, elevated liver enzymes and blurry vision. She was discharged after evaluation.  Her pregnancy was, otherwise, uneventful. She reports fetal anatomical survey performed at 20 weeks was normal.  At cesarean section, 1 small intramural myoma 2 halves) was also removed.  In the interval between pregnancies, her blood pressures remained high. She takes labetalol  for blood pressure control. Today's BP 108/78 mm Hg. She is being followed by her PCP. Serum creatinine levels were slightly increased before her previous pregnancy. 01/2015: 1.2 12/2015: 1.03  In May 2019, she had vascular renal ultrasound that showed small left kidney and a normal right kidney.  She is being followed by her nephrologist and the patient informed that smaller left kidney is congenital. Nephrologist did not consider the diagnosis of chronic kidney disease. Recent creatinine (10/29/18) was 0.96 and eGFR was within normal range.  Thrombophilia (acquired and inherited) work-up was normal except MTHFR mutation (C677T). Homocysteine levels are normal. Review of systems: No headache or visual disturbances. No epistaxis or ear discharge or neck swelling. No chest pain or palpitations. Has nausea and occasional vomiting (early pregnancy). No abdominal or joint pains. No constipation or diarrheal. No vaginal bleeding or recurrent UTI or dyspareunia.  PMH: Chronic hypertension, anxiety, depression. No history of diabetes or thyroid disorder. 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 PSH: Classical cesarean section, TOF surgeries, wisdom tooth surgery. Medications: -Labetalol 400 mg twice daily. -Buspar 300 mg daily. -Sertraline 200 mg daily. -Aspirin 325 mg daily. -Prenatal vitamins. -Folic acid 696 micrograms daily. -Vitamin D3 2,000 IU daily. -Tylenol prn.  Allergies: Latex, bananas, kiwis. Social: Denies tobacco or drug or alcohol use. 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 Family: Father has hypertension and mother is in good health. No history of venous thromboembolism in the family. Gyn history: History of CIN 2 and HPV infection. No history of LEEP or cone-biopsy procedures. No history of breast disease. She was having regular menstrual cycles, and this was a planned conception.  Recent Labs: Hb 13, Hct 37.6,  WBC 5.6, PLT  162, electrolytes normal, creatinine 0.96, ALT 9, AST 11, protein/creatinine ratio not increased, urinalysis normal.  I counseled the patient on the following: Chronic hypertension and history of preeclampsia with severe features: -I discussed the possible causes of preeclampsia that is more common in first pregnancies. In patients with chronic hypertension, the likelihood of preeclampsia is increased (30%). -Recurrence rate of preeclampsia is between 25% to 50%. -Recurrence rate of HELLP syndrome is less than 5%. -Discussed the benefit of low-dose aspirin in delaying or preventing preeclampsia. Although the studies were conducted using aspirin 150 mg daily, we recommend 81 mg in the [place]. However, the patient was advised to continue aspirin 325 mg by her nephrologist (for renal indication). I informed her that this is a sufficient dose for prevention of preeclampsia. Patient does not have GI bleed or any other side effects from aspirin. -Chronic hypertension can complicate pregnancies and can lead to fetal growth restriction, superimposed preeclampsia, indicated preterm delivery and placental abruption. -I discussed ultrasound protocol of serial fetal growth assessments and weekly antenatal testing from 32 weeks till delivery.  R/o Chronic kidney disease (CKD): I counseled the couple on the likelihood of mild CKD. However, I defer it to her nephrologist to confirm the diagnosis. Increased serum creatinine levels (1.2 mg) before her first pregnancy raises the possibility of mild CKD. I reassured the couple that pregnancy does not adversely affect renal function in the long term. We will be closely monitoring fetal growth restriction and for development of preeclampsia because of her history.  Classical cesarean section: -Likelihood of uterine rupture is increased especially in labor. I informed the couple that she would be recommended repeat cesarean section (low-transverse incision) at  37 weeks' gestation. -Careful evaluation of placenta at anatomy and follow-up scans is essential to rule out placenta accreta.  Medications: -Labetalol is safe in pregnancy. Large studies showed a small increase in fetal growth restriction (not severe).   -Sertraline: No increase in congenital malformations is expected with the use of this medication. It is an SSRI agent. A small absolute increase in primary pulmonary hypertension has been observed. However, untreated depression can be associated with adverse maternal outcomes that can override the above-mentioned risk. Patient was counseled on the small risk for primary pulmonary hypertension.  -Wellbutrin (Bupropion) is not associated with an increase in congenital malformations. ADHD in the offspring has been reported, but the study could have been confounding effect of smoking.  -Buspar: Not adequate information is available on the use of this medication in pregnancy. No increased congenital malformation is expected. I informed the patient to consult her psychiatrist for discontinuation of this drug at least in the first trimester. If this drug is absolutely essential for maternal benefit, it can be continued.  MTHFR heterozygous mutation: I reassured her that MTHFR mutation is commonly seen in Caucasian population and in the absence of hyperhomocysteinemia, it does not lead to any adverse pregnancy outcomes.  Prenatal:  -Discussed screening for fetal aneuploidies. Patient is planning to have cell-free fetal DNA screening. She will be 34 years' old at delivery. I also informed her that only invasive testing-chorionic villus sampling or amniocentesis will give a definitive result on the fetal karyotype. -We discussed ultrasound for fetal growth and antenatal testing.  Recommendations: -Cell-free fetal DNA screening after 10 weeks. -First-trimester ultrasound at 12 weeks. -Fetal anatomy at 18 to 20 weeks. -Fetal growth assessment every 4  weeks from 24 weeks. -Weekly BPP from 32 weeks. -Delivery at 37 weeks (repeat cesarean section). -Continue labetalol and  adjust dosage as necessary. -Continue aspirin 325 mg daily. -CBC every trimester to rule out anemia. -Serum creatinine, BUN monthly (or as per nephrologist). -TSH. -Close follow-up with nephrologist.  -Influenza vaccination (discussed and recommended).   Thank you for your consult. Please do not hesitate to contact me if you have any questions or concerns.  Consultation including face-to-face counseling: 60 min.

## 2018-11-24 ENCOUNTER — Other Ambulatory Visit (HOSPITAL_COMMUNITY): Payer: Self-pay | Admitting: Obstetrics

## 2018-11-24 DIAGNOSIS — Z3A18 18 weeks gestation of pregnancy: Secondary | ICD-10-CM

## 2018-11-24 DIAGNOSIS — Z3689 Encounter for other specified antenatal screening: Secondary | ICD-10-CM

## 2018-11-24 DIAGNOSIS — O09292 Supervision of pregnancy with other poor reproductive or obstetric history, second trimester: Secondary | ICD-10-CM

## 2018-11-25 ENCOUNTER — Other Ambulatory Visit (HOSPITAL_COMMUNITY): Payer: Self-pay | Admitting: Obstetrics

## 2018-11-25 DIAGNOSIS — Z3A19 19 weeks gestation of pregnancy: Secondary | ICD-10-CM

## 2018-11-25 DIAGNOSIS — O09299 Supervision of pregnancy with other poor reproductive or obstetric history, unspecified trimester: Secondary | ICD-10-CM

## 2018-11-25 DIAGNOSIS — Z3689 Encounter for other specified antenatal screening: Secondary | ICD-10-CM

## 2018-11-25 DIAGNOSIS — Z3A24 24 weeks gestation of pregnancy: Secondary | ICD-10-CM

## 2018-11-28 ENCOUNTER — Other Ambulatory Visit: Payer: Self-pay

## 2018-12-01 ENCOUNTER — Other Ambulatory Visit: Payer: Self-pay

## 2019-01-03 ENCOUNTER — Encounter: Payer: Self-pay | Admitting: Family Medicine

## 2019-01-03 ENCOUNTER — Ambulatory Visit: Payer: 59 | Admitting: Family Medicine

## 2019-01-03 ENCOUNTER — Other Ambulatory Visit: Payer: Self-pay

## 2019-01-03 DIAGNOSIS — M25371 Other instability, right ankle: Secondary | ICD-10-CM | POA: Diagnosis not present

## 2019-01-03 NOTE — Progress Notes (Signed)
[facility] [address], [place] [address] Phone: [phone] Subjective:   Angelica [name], am serving as a scribe for Dr. Hulan Saas.  I'm seeing this patient by the request  of:    CC: Right ankle pain follow-up  [id number]   10/18/2018 Discussed with patient again at great length.  Discussed which activities of doing which was to avoid.  Discussed importance during pregnancy to wear good shoes as well to stop the breakdown of the transverse arch more.  Follow-up again 4 to 8 weeks  Update 01/03/2019 Angelica [name] is a 33 y.o. female coming in with complaint of right ankle and foot pain. Patient states that she does have pain with walking. Has not returned to running. Has Aircast but has not been wearing it. Does state that she is feeling better but the soreness is still present with walking.  Overall feeling approximately 70 to 80% better even with her being noncompliant.    Past Medical History:  Diagnosis Date  . ADHD   . Allergy   . Anxiety   . Chronic hypertension   . Depression   . HELLP syndrome 2018  . Pregnancy induced hypertension   . Tracheoesophageal fistula [facility])    Past Surgical History:  Procedure Laterality Date  . CESAREAN SECTION N/A 12/27/2016   Procedure: CESAREAN SECTION;  Surgeon: Aloha Gell, MD;  Location: [place];  Service: Obstetrics;  Laterality: N/A;  . DILATION AND CURETTAGE OF UTERUS  01/2013  . ESOPHAGUS SURGERY    . TRACHEAL SURGERY    . TRACHEOESOPHAGEAL FISTULA REPAIR    . VASCULAR RING REPAIR     Social History   Socioeconomic History  . Marital status: Married    Spouse name: n/a  . Number of children: 0  . Years of education: Master's  . Highest education level: Not on file  Occupational History  . 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 Occupation: [employer]    Comment: Biomedical engineer   Social Needs  . Financial resource strain: Not on file  . Food insecurity    Worry: Not on file    Inability: Not on file  . Transportation needs    Medical: Not on file    Non-medical: Not on file  Tobacco Use  . Smoking status: Former Research scientist (life sciences)  . Smokeless tobacco: Never Used  Substance and Sexual Activity  . Alcohol use: Yes    Alcohol/week: 5.0 - 10.0 standard drinks    Types: 5 - 10 Standard drinks or equivalent per week  . Drug use: [name]  . Sexual activity: Yes    Partners: Male  Lifestyle  . Physical activity    Days per week: Not on file    Minutes per session: Not on file  . Stress: Not on file  Relationships  . Social Herbalist on phone: Not on file    Gets together: Not on file    Attends religious service: Not on file    Active member of club or organization: Not on file    Attends meetings of clubs or organizations: Not on file    Relationship status: Not on file  Other Topics Concern  . Not on file  Social History Narrative   Graduated from [employer] 2008.   Lives with her fiance.   Engaged to be married in May 2017.  Allergies  Allergen Reactions  . Latex Anaphylaxis and Hives  . Avocado     Sensitivity, not sure  . Banana Other (See Comments)    Sensitivity, not sure  . Kiwi Extract     Sensitivity, not sure   Family History  Problem Relation Age of Onset  . Diabetes Maternal Grandfather   . Stroke Paternal Grandmother   . Hyperlipidemia Paternal Grandmother   . Mental illness Mother   . Mental illness Sister   . Cancer Maternal Grandmother      Current Outpatient Medications (Cardiovascular):  Marland Kitchen  EPINEPHrine 0.3 mg/0.3 mL IJ SOAJ injection, INJECT 0.3MLS INTO THE MUSCLE ONCE .  labetalol (NORMODYNE) 200 MG tablet, TAKE 2 TABLETS BY MOUTH EVERY 8 HOURS.  Current Outpatient Medications (Respiratory):  .  loratadine (CLARITIN) 10 MG tablet, Take 10 mg by mouth daily as needed for allergies or rhinitis.  Current  Outpatient Medications (Analgesics):  .  aspirin 325 MG tablet, Take 325 mg by mouth daily.  Current Outpatient Medications (Hematological):  .  folic acid (FOLATE) A999333 MCG tablet, Take 400 mcg by mouth daily.  Current Outpatient Medications (Other):  Marland Kitchen  buPROPion (WELLBUTRIN XL) 300 MG 24 hr tablet, Take 300 mg by mouth daily. .  busPIRone (BUSPAR) 15 MG tablet, Take 15 mg by mouth 3 (three) times daily. .  Cholecalciferol (VITAMIN D3 PO), Take by mouth. .  Diclofenac Sodium (PENNSAID) 2 % SOLN, Place 2 g onto the skin 2 (two) times daily. (Patient not taking: Reported on 11/11/2018) .  LORazepam (ATIVAN) 0.5 MG tablet, Take 1 tablet (0.5 mg total) by mouth every 6 (six) hours as needed for anxiety. (Patient not taking: Reported on 11/11/2018) .  Prenat w/o A-FE-Methfol-FA-DHA (PNV-DHA) 27-0.6-0.4-300 MG CAPS, Take by mouth. .  sertraline (ZOLOFT) 100 MG tablet, Take 2 tablets (200 mg total) by mouth daily. .  Vitamin D, Ergocalciferol, (DRISDOL) 1.25 MG (50000 UT) CAPS capsule, Take 1 capsule (50,000 Units total) by mouth every 7 (seven) days. (Patient not taking: Reported on 11/11/2018)    Past medical history, social, surgical and family history all reviewed in electronic medical record.  [name] pertanent information unless stated regarding to the chief complaint.   Review of Systems:  [name] headache, visual changes, nausea, vomiting, diarrhea, constipation, dizziness, abdominal pain, skin rash, fevers, chills, night sweats, weight loss, swollen lymph nodes, body aches, joint swelling, muscle aches, chest pain, shortness of breath, mood changes.   Objective  Last menstrual period 09/01/2018, unknown if currently breastfeeding.    General: [name] apparent distress alert and oriented x3 mood and affect normal, dressed appropriately.  HEENT: Pupils equal, extraocular movements intact  Respiratory: Patient's speak in full sentences and does not appear short of breath  Cardiovascular: [name] lower  extremity edema, non tender, [name] erythema  Skin: Warm dry intact with [name] signs of infection or rash on extremities or on axial skeleton.  Abdomen: Soft nontender gravid Neuro: Cranial nerves II through XII are intact, neurovascularly intact in all extremities with 2+ DTRs and 2+ pulses.  Lymph: [name] lymphadenopathy of posterior or anterior cervical chain or axillae bilaterally.  Gait normal with good balance and coordination.  MSK:  Non tender with full range of motion and good stability and symmetric strength and tone of shoulders, elbows, wrist, hip, knee bilaterally.  Right ankle nontender on exam today.  Full range of motion.  5 out of 5 strength in all range of motion.   Impression and Recommendations:  The above documentation has been reviewed and is accurate and complete Angelica Pulley, DO       Note: This dictation was prepared with Dragon dictation along with smaller phrase technology. Any transcriptional errors that result from this process are unintentional.

## 2019-01-03 NOTE — Assessment & Plan Note (Signed)
Patient is doing relatively better at this moment.  Discussed posture and ergonomics that can help.  Wear brace as needed.  Encouraged home exercise, discussed that later in pregnancy could have more instability and may need to brace more duration.  Follow-up with me as needed as long as patient continues to make improvement.

## 2019-01-10 ENCOUNTER — Other Ambulatory Visit: Payer: Self-pay

## 2019-01-10 ENCOUNTER — Ambulatory Visit (HOSPITAL_COMMUNITY): Payer: 59 | Admitting: *Deleted

## 2019-01-10 ENCOUNTER — Encounter (HOSPITAL_COMMUNITY): Payer: Self-pay

## 2019-01-10 ENCOUNTER — Ambulatory Visit (HOSPITAL_COMMUNITY)
Admission: RE | Admit: 2019-01-10 | Discharge: 2019-01-10 | Disposition: A | Discharge status: Home / Self Care | Payer: 59 | Source: Ambulatory Visit | Attending: Obstetrics and Gynecology | Admitting: Obstetrics and Gynecology

## 2019-01-10 VITALS — BP 94/60 | HR 82 | Temp 98.0°F

## 2019-01-10 DIAGNOSIS — O0990 Supervision of high risk pregnancy, unspecified, unspecified trimester: Secondary | ICD-10-CM

## 2019-01-10 DIAGNOSIS — Z3A18 18 weeks gestation of pregnancy: Secondary | ICD-10-CM

## 2019-01-10 DIAGNOSIS — O09292 Supervision of pregnancy with other poor reproductive or obstetric history, second trimester: Secondary | ICD-10-CM | POA: Diagnosis present

## 2019-01-10 DIAGNOSIS — O34219 Maternal care for unspecified type scar from previous cesarean delivery: Secondary | ICD-10-CM

## 2019-01-10 DIAGNOSIS — O2692 Pregnancy related conditions, unspecified, second trimester: Secondary | ICD-10-CM | POA: Diagnosis not present

## 2019-01-10 DIAGNOSIS — Z3689 Encounter for other specified antenatal screening: Secondary | ICD-10-CM

## 2019-01-10 DIAGNOSIS — O09212 Supervision of pregnancy with history of pre-term labor, second trimester: Secondary | ICD-10-CM

## 2019-02-20 ENCOUNTER — Other Ambulatory Visit: Payer: Self-pay

## 2019-02-20 ENCOUNTER — Encounter (HOSPITAL_COMMUNITY): Payer: Self-pay | Admitting: Obstetrics and Gynecology

## 2019-02-20 ENCOUNTER — Inpatient Hospital Stay (HOSPITAL_COMMUNITY)
Admission: AD | Admit: 2019-02-20 | Discharge: 2019-02-20 | Disposition: A | Discharge status: Home / Self Care | Payer: 59 | Attending: Obstetrics and Gynecology | Admitting: Obstetrics and Gynecology

## 2019-02-20 DIAGNOSIS — Z79899 Other long term (current) drug therapy: Secondary | ICD-10-CM | POA: Diagnosis not present

## 2019-02-20 DIAGNOSIS — R519 Headache, unspecified: Secondary | ICD-10-CM | POA: Diagnosis present

## 2019-02-20 DIAGNOSIS — R109 Unspecified abdominal pain: Secondary | ICD-10-CM | POA: Diagnosis present

## 2019-02-20 DIAGNOSIS — O26892 Other specified pregnancy related conditions, second trimester: Secondary | ICD-10-CM

## 2019-02-20 DIAGNOSIS — O10919 Unspecified pre-existing hypertension complicating pregnancy, unspecified trimester: Secondary | ICD-10-CM

## 2019-02-20 DIAGNOSIS — O10212 Pre-existing hypertensive chronic kidney disease complicating pregnancy, second trimester: Secondary | ICD-10-CM | POA: Insufficient documentation

## 2019-02-20 DIAGNOSIS — Z7982 Long term (current) use of aspirin: Secondary | ICD-10-CM | POA: Diagnosis not present

## 2019-02-20 DIAGNOSIS — F909 Attention-deficit hyperactivity disorder, unspecified type: Secondary | ICD-10-CM | POA: Diagnosis not present

## 2019-02-20 DIAGNOSIS — F329 Major depressive disorder, single episode, unspecified: Secondary | ICD-10-CM | POA: Insufficient documentation

## 2019-02-20 DIAGNOSIS — Z87891 Personal history of nicotine dependence: Secondary | ICD-10-CM | POA: Diagnosis not present

## 2019-02-20 DIAGNOSIS — Z3A23 23 weeks gestation of pregnancy: Secondary | ICD-10-CM | POA: Diagnosis not present

## 2019-02-20 DIAGNOSIS — O99342 Other mental disorders complicating pregnancy, second trimester: Secondary | ICD-10-CM | POA: Insufficient documentation

## 2019-02-20 DIAGNOSIS — N189 Chronic kidney disease, unspecified: Secondary | ICD-10-CM | POA: Diagnosis not present

## 2019-02-20 DIAGNOSIS — F419 Anxiety disorder, unspecified: Secondary | ICD-10-CM | POA: Diagnosis not present

## 2019-02-20 DIAGNOSIS — I129 Hypertensive chronic kidney disease with stage 1 through stage 4 chronic kidney disease, or unspecified chronic kidney disease: Secondary | ICD-10-CM | POA: Diagnosis not present

## 2019-02-20 DIAGNOSIS — O09292 Supervision of pregnancy with other poor reproductive or obstetric history, second trimester: Secondary | ICD-10-CM

## 2019-02-20 LAB — CBC
HCT: 36.2 % (ref 36.0–46.0)
Hemoglobin: 12.7 g/dL (ref 12.0–15.0)
MCH: 31 pg (ref 26.0–34.0)
MCHC: 35.1 g/dL (ref 30.0–36.0)
MCV: 88.3 fL (ref 80.0–100.0)
Platelets: 163 10*3/uL (ref 150–400)
RBC: 4.1 MIL/uL (ref 3.87–5.11)
RDW: 12.5 % (ref 11.5–15.5)
WBC: 8.9 10*3/uL (ref 4.0–10.5)
nRBC: 0 % (ref 0.0–0.2)

## 2019-02-20 LAB — COMPREHENSIVE METABOLIC PANEL
ALT: 13 U/L (ref 0–44)
AST: 16 U/L (ref 15–41)
Albumin: 3.3 g/dL — ABNORMAL LOW (ref 3.5–5.0)
Alkaline Phosphatase: 38 U/L (ref 38–126)
Anion gap: 8 (ref 5–15)
BUN: 12 mg/dL (ref 6–20)
CO2: 23 mmol/L (ref 22–32)
Calcium: 9.2 mg/dL (ref 8.9–10.3)
Chloride: 105 mmol/L (ref 98–111)
Creatinine, Ser: 0.93 mg/dL (ref 0.44–1.00)
GFR calc Af Amer: >60 mL/min (ref >60)
GFR calc non Af Amer: >60 mL/min (ref >60)
Glucose, Bld: 85 mg/dL (ref 70–99)
Potassium: 4.3 mmol/L (ref 3.5–5.1)
Sodium: 136 mmol/L (ref 135–145)
Total Bilirubin: 0.4 mg/dL (ref 0.3–1.2)
Total Protein: 6.4 g/dL — ABNORMAL LOW (ref 6.5–8.1)

## 2019-02-20 LAB — URINALYSIS, ROUTINE W REFLEX MICROSCOPIC
Bilirubin Urine: NEGATIVE
Glucose, UA: NEGATIVE mg/dL
Hgb urine dipstick: NEGATIVE
Ketones, ur: NEGATIVE mg/dL
Leukocytes,Ua: NEGATIVE
Nitrite: NEGATIVE
Protein, ur: NEGATIVE mg/dL
Specific Gravity, Urine: 1.004 — ABNORMAL LOW (ref 1.005–1.030)
pH: 6 (ref 5.0–8.0)

## 2019-02-20 LAB — PROTEIN / CREATININE RATIO, URINE
Creatinine, Urine: 29.45 mg/dL
Total Protein, Urine: <6 mg/dL

## 2019-02-20 NOTE — MAU Provider Note (Signed)
Chief Complaint:  Abdominal Pain and Headache   First Provider Initiated Contact with Patient 02/20/19 1729      HPI: Angelica [name] is a 33 y.o. G3P0110 at [redacted]w[redacted]d by LMP pt of [facility] with medical hx significant for [facility] with kidney disease, hx of HELLP with 25 week delivery and neonatal demise with previous pregnancy who presents to maternity admissions reporting mild headache and mild RUQ abdominal pain with onset today. The headache is constant, frontal, and has not required any treatment. Her abdominal pain is located only in the RUQ of her abdomen and feels like gas pain per the pt.  With her history, she wanted to come get her labs checked to make sure there were no signs of preeclampsia.  She reports PEC labs have been normal during pregnancy except for creatinine slightly elevated, but below her prepregnancy baseline. She is feeling normal fetal movement and denies any cramping/contractions today.    HPI  Past Medical History: Past Medical History:  Diagnosis Date  . ADHD   . Allergy   . Anxiety   . Chronic hypertension   . Depression   . HELLP syndrome 2018  . Pregnancy induced hypertension   . Tracheoesophageal fistula (HCC)     Past obstetric history: OB History  Gravida Para Term Preterm AB Living  3 1   1 1     SAB TAB Ectopic Multiple Live Births  0 1          # Outcome Date GA Lbr Len/2nd Weight Sex Delivery Anes PTL Lv  3 Current           2 Preterm 12/27/16 [redacted]w[redacted]d    CS-Classical   FD  1 TAB             Past Surgical History: Past Surgical History:  Procedure Laterality Date  . CESAREAN SECTION N/A 12/27/2016   Procedure: CESAREAN SECTION;  Surgeon: Aloha Gell, MD;  Location: [place];  Service: Obstetrics;  Laterality: N/A;  . DILATION AND CURETTAGE OF UTERUS  01/2013  . ESOPHAGUS SURGERY    . TRACHEAL SURGERY    . TRACHEOESOPHAGEAL FISTULA REPAIR    . VASCULAR RING REPAIR      Family History: Family History  Problem  Relation Age of Onset  . Diabetes Maternal Grandfather   . Stroke Paternal Grandmother   . Hyperlipidemia Paternal Grandmother   . Mental illness Mother   . Mental illness Sister   . Cancer Maternal Grandmother     Social History: Social History   Tobacco Use  . Smoking status: Former Research scientist (life sciences)  . Smokeless tobacco: Never Used  Substance Use Topics  . Alcohol use: Not Currently    Alcohol/week: 5.0 - 10.0 standard drinks    Types: 5 - 10 Standard drinks or equivalent per week  . Drug use: No    Allergies:  Allergies  Allergen Reactions  . Latex Anaphylaxis and Hives  . Avocado     Sensitivity, not sure  . Banana Other (See Comments)    Sensitivity, not sure  . Kiwi Extract     Sensitivity, not sure    Meds:  Medications Prior to Admission  Medication Sig Dispense Refill Last Dose  . aspirin 325 MG tablet Take 325 mg by mouth daily.   02/20/2019 at Unknown time  . buPROPion (WELLBUTRIN XL) 300 MG 24 hr tablet Take 300 mg by mouth daily.   02/20/2019 at Unknown time  . busPIRone (BUSPAR) 15 MG tablet Take  15 mg by mouth 3 (three) times daily.   02/20/2019 at Unknown time  . Cholecalciferol (VITAMIN D3 PO) Take by mouth.   02/19/2019 at Unknown time  . folic acid (FOLATE) A999333 MCG tablet Take 400 mcg by mouth daily.   02/19/2019 at Unknown time  . labetalol (NORMODYNE) 200 MG tablet TAKE 2 TABLETS BY MOUTH EVERY 8 HOURS. 180 tablet 8 02/20/2019 at Unknown time  . loratadine (CLARITIN) 10 MG tablet Take 10 mg by mouth daily as needed for allergies or rhinitis.   Past Month at Unknown time  . Prenat w/o A-FE-Methfol-FA-DHA (PNV-DHA) 27-0.6-0.4-300 MG CAPS Take by mouth.   02/19/2019 at Unknown time  . sertraline (ZOLOFT) 100 MG tablet Take 2 tablets (200 mg total) by mouth daily. 60 tablet 2 02/20/2019 at Unknown time  . Diclofenac Sodium (PENNSAID) 2 % SOLN Place 2 g onto the skin 2 (two) times daily. (Patient not taking: Reported on 01/10/2019) 112 g 3   . EPINEPHrine 0.3  mg/0.3 mL IJ SOAJ injection INJECT 0.3MLS INTO THE MUSCLE ONCE 1 Device 5   . LORazepam (ATIVAN) 0.5 MG tablet Take 1 tablet (0.5 mg total) by mouth every 6 (six) hours as needed for anxiety. (Patient not taking: Reported on 01/10/2019) 20 tablet 0   . Vitamin D, Ergocalciferol, (DRISDOL) 1.25 MG (50000 UT) CAPS capsule Take 1 capsule (50,000 Units total) by mouth every 7 (seven) days. 12 capsule 0     ROS:  Review of Systems  Constitutional: Negative for chills, fatigue and fever.  Eyes: Negative for visual disturbance.  Respiratory: Negative for shortness of breath.   Cardiovascular: Negative for chest pain.  Gastrointestinal: Positive for abdominal pain. Negative for nausea and vomiting.  Genitourinary: Negative for difficulty urinating, dysuria, flank pain, pelvic pain, vaginal bleeding, vaginal discharge and vaginal pain.  Neurological: Positive for headaches. Negative for dizziness.  Psychiatric/Behavioral: Negative.      I have reviewed patient's Past Medical Hx, Surgical Hx, Family Hx, Social Hx, medications and allergies.   Physical Exam   Patient Vitals for the past 24 hrs:  BP Temp Temp src Pulse Resp SpO2 Height Weight  02/20/19 1730 106/69 -- -- 68 -- -- -- --  02/20/19 1715 118/73 -- -- 71 -- -- -- --  02/20/19 1709 114/76 -- -- 68 -- -- -- --  02/20/19 1643 108/64 98.1 F (36.7 C) Oral 75 20 100 % -- --  02/20/19 1637 -- -- -- -- -- -- 5\' 5"  (1.651 m) 66.7 kg   Constitutional: Well-developed, well-nourished female in no acute distress.  HEART: normal rate, heart sounds, regular rhythm RESP: normal effort, lung sounds clear and equal bilaterally GI: Abd soft, non-tender, gravid appropriate for gestational age.  MS: Extremities nontender, no edema, normal ROM Neurologic: Alert and oriented x 4.  GU: Neg CVAT.     FHT:  Baseline 145, moderate variability, accelerations present, no decelerations Contractions: None on toco or to palpation   Labs: Results for  orders placed or performed during the hospital encounter of 02/20/19 (from the past 24 hour(s))  Protein / creatinine ratio, urine     Status: None   Collection Time: 02/20/19  5:01 PM  Result Value Ref Range   Creatinine, Urine 29.45 mg/dL   Total Protein, Urine <6 mg/dL   Protein Creatinine Ratio        0.00 - 0.15 mg/mg[Cre]  Urinalysis, Routine w reflex microscopic     Status: Abnormal   Collection Time: 02/20/19  5:05 PM  Result Value Ref Range   Color, Urine STRAW (A) YELLOW   APPearance CLEAR CLEAR   Specific Gravity, Urine 1.004 (L) 1.005 - 1.030   pH 6.0 5.0 - 8.0   Glucose, UA NEGATIVE NEGATIVE mg/dL   Hgb urine dipstick NEGATIVE NEGATIVE   Bilirubin Urine NEGATIVE NEGATIVE   Ketones, ur NEGATIVE NEGATIVE mg/dL   Protein, ur NEGATIVE NEGATIVE mg/dL   Nitrite NEGATIVE NEGATIVE   Leukocytes,Ua NEGATIVE NEGATIVE  CBC     Status: None   Collection Time: 02/20/19  5:17 PM  Result Value Ref Range   WBC 8.9 4.0 - 10.5 K/uL   RBC 4.10 3.87 - 5.11 MIL/uL   Hemoglobin 12.7 12.0 - 15.0 g/dL   HCT 36.2 36.0 - 46.0 %   MCV 88.3 80.0 - 100.0 fL   MCH 31.0 26.0 - 34.0 pg   MCHC 35.1 30.0 - 36.0 g/dL   RDW 12.5 11.5 - 15.5 %   Platelets 163 150 - 400 K/uL   nRBC 0.0 0.0 - 0.2 %  Comprehensive metabolic panel     Status: Abnormal   Collection Time: 02/20/19  5:17 PM  Result Value Ref Range   Sodium 136 135 - 145 mmol/L   Potassium 4.3 3.5 - 5.1 mmol/L   Chloride 105 98 - 111 mmol/L   CO2 23 22 - 32 mmol/L   Glucose, Bld 85 70 - 99 mg/dL   BUN 12 6 - 20 mg/dL   Creatinine, Ser 0.93 0.44 - 1.00 mg/dL   Calcium 9.2 8.9 - 10.3 mg/dL   Total Protein 6.4 (L) 6.5 - 8.1 g/dL   Albumin 3.3 (L) 3.5 - 5.0 g/dL   AST 16 15 - 41 U/L   ALT 13 0 - 44 U/L   Alkaline Phosphatase 38 38 - 126 U/L   Total Bilirubin 0.4 0.3 - 1.2 mg/dL   GFR calc non Af Amer >60 >60 mL/min   GFR calc Af Amer >60 >60 mL/min   Anion gap 8 5 - 15      Imaging:  No results found.  MAU Course/MDM: Orders  Placed This Encounter  Procedures  . Urinalysis, Routine w reflex microscopic  . CBC  . Comprehensive metabolic panel  . Protein / creatinine ratio, urine  . Discharge patient    No orders of the defined types were placed in this encounter.    NST reviewed and appropriate for gestational age Preeclampsia labs all wnl with P/C ratio too low to measure  Creatinine 0.92, c/w pt previous labs, lower than prepregnancy values With normal BP and normal labs in MAU, and mild symptoms, no evidence of PEC or HELLP today Reassurance provided to pt Continue close outpatient monitoring PEC precautions/reasons to return reviewed Keep appt in office and return to MAU as needed   Assessment: 1. Headache in pregnancy, antepartum, second trimester   2. History of pre-eclampsia in prior pregnancy, currently pregnant in second trimester   3. Chronic hypertension affecting pregnancy     Plan: Discharge home with preeclampsia precautions Labor precautions and fetal kick counts Follow-up Information    Aloha Gell, MD Follow up.   Specialty: Obstetrics and Gynecology Why: As scheduled. Return to MAU as needed. Contact information: [place] [id number] [phone]          Allergies as of 02/20/2019      Reactions   Latex Anaphylaxis, Hives   Avocado    Sensitivity, not sure   Banana Other (See Comments)  Sensitivity, not sure   Kiwi Extract    Sensitivity, not sure      Medication List    TAKE these medications   aspirin 325 MG tablet Take 325 mg by mouth daily.   buPROPion 300 MG 24 hr tablet Commonly known as: WELLBUTRIN XL Take 300 mg by mouth daily.   busPIRone 15 MG tablet Commonly known as: BUSPAR Take 15 mg by mouth 3 (three) times daily.   EPINEPHrine 0.3 mg/0.3 mL Soaj injection Commonly known as: EPI-PEN INJECT 0.3MLS INTO THE MUSCLE ONCE   Folate 400 MCG tablet Generic drug: folic acid Take A999333 mcg by mouth daily.   labetalol  200 MG tablet Commonly known as: NORMODYNE TAKE 2 TABLETS BY MOUTH EVERY 8 HOURS.   loratadine 10 MG tablet Commonly known as: CLARITIN Take 10 mg by mouth daily as needed for allergies or rhinitis.   LORazepam 0.5 MG tablet Commonly known as: ATIVAN Take 1 tablet (0.5 mg total) by mouth every 6 (six) hours as needed for anxiety.   Pennsaid 2 % Soln Generic drug: Diclofenac Sodium Place 2 g onto the skin 2 (two) times daily.   PNV-DHA 27-0.6-0.4-300 MG Caps Take by mouth.   sertraline 100 MG tablet Commonly known as: ZOLOFT Take 2 tablets (200 mg total) by mouth daily.   Vitamin D (Ergocalciferol) 1.25 MG (50000 UT) Caps capsule Commonly known as: DRISDOL Take 1 capsule (50,000 Units total) by mouth every 7 (seven) days.   VITAMIN D3 PO Take by mouth.       Fatima Blank Certified Nurse-Midwife 02/20/2019 7:05 PM

## 2019-02-20 NOTE — MAU Note (Signed)
Angelica [name] is a 33 y.o. at [redacted]w[redacted]d here in MAU reporting: headache and RUQ pain that started today. Hx of pre eclampsia. Has not taken any medication for pain. Checked BP at home multiple times today and they were all normal. Earlier she turned her head to fast and saw some stars in her vision but in general denies visual changes. No lower abdominal pain, bleeding, or LOF. +FM  Onset of complaint: today  Pain score: headache 2/10, RUQ 2/10  Vitals:   02/20/19 1643  BP: 108/64  Pulse: 75  Resp: 20  Temp: 98.1 F (36.7 C)  SpO2: 100%     FHT: +FM  Lab orders placed from triage: UA

## 2019-02-20 NOTE — Discharge Instructions (Signed)

## 2019-02-22 ENCOUNTER — Ambulatory Visit (HOSPITAL_COMMUNITY): Payer: 59 | Admitting: *Deleted

## 2019-02-22 ENCOUNTER — Other Ambulatory Visit: Payer: Self-pay

## 2019-02-22 ENCOUNTER — Other Ambulatory Visit (HOSPITAL_COMMUNITY): Payer: Self-pay | Admitting: *Deleted

## 2019-02-22 ENCOUNTER — Encounter (HOSPITAL_COMMUNITY): Payer: Self-pay

## 2019-02-22 ENCOUNTER — Ambulatory Visit (HOSPITAL_COMMUNITY)
Admission: RE | Admit: 2019-02-22 | Discharge: 2019-02-22 | Disposition: A | Discharge status: Home / Self Care | Payer: 59 | Source: Ambulatory Visit | Attending: Obstetrics and Gynecology | Admitting: Obstetrics and Gynecology

## 2019-02-22 VITALS — BP 110/70 | HR 85 | Temp 98.2°F

## 2019-02-22 DIAGNOSIS — O09292 Supervision of pregnancy with other poor reproductive or obstetric history, second trimester: Secondary | ICD-10-CM | POA: Insufficient documentation

## 2019-02-22 DIAGNOSIS — Z3689 Encounter for other specified antenatal screening: Secondary | ICD-10-CM | POA: Insufficient documentation

## 2019-02-22 DIAGNOSIS — O2692 Pregnancy related conditions, unspecified, second trimester: Secondary | ICD-10-CM | POA: Diagnosis not present

## 2019-02-22 DIAGNOSIS — O0990 Supervision of high risk pregnancy, unspecified, unspecified trimester: Secondary | ICD-10-CM

## 2019-02-22 DIAGNOSIS — O09212 Supervision of pregnancy with history of pre-term labor, second trimester: Secondary | ICD-10-CM | POA: Diagnosis not present

## 2019-02-22 DIAGNOSIS — O26833 Pregnancy related renal disease, third trimester: Secondary | ICD-10-CM

## 2019-02-22 DIAGNOSIS — O09299 Supervision of pregnancy with other poor reproductive or obstetric history, unspecified trimester: Secondary | ICD-10-CM

## 2019-02-22 DIAGNOSIS — N189 Chronic kidney disease, unspecified: Secondary | ICD-10-CM | POA: Insufficient documentation

## 2019-02-22 DIAGNOSIS — O26832 Pregnancy related renal disease, second trimester: Secondary | ICD-10-CM | POA: Insufficient documentation

## 2019-02-22 DIAGNOSIS — O34219 Maternal care for unspecified type scar from previous cesarean delivery: Secondary | ICD-10-CM

## 2019-02-22 DIAGNOSIS — Z3A24 24 weeks gestation of pregnancy: Secondary | ICD-10-CM | POA: Insufficient documentation

## 2019-02-22 DIAGNOSIS — Z362 Encounter for other antenatal screening follow-up: Secondary | ICD-10-CM | POA: Diagnosis not present

## 2019-03-13 ENCOUNTER — Encounter (HOSPITAL_COMMUNITY): Payer: Self-pay | Admitting: Obstetrics

## 2019-03-13 ENCOUNTER — Other Ambulatory Visit: Payer: Self-pay

## 2019-03-13 ENCOUNTER — Inpatient Hospital Stay (HOSPITAL_COMMUNITY)
Admission: AD | Admit: 2019-03-13 | Discharge: 2019-03-13 | Disposition: A | Discharge status: Home / Self Care | Payer: 59 | Attending: Obstetrics | Admitting: Obstetrics

## 2019-03-13 DIAGNOSIS — Z7982 Long term (current) use of aspirin: Secondary | ICD-10-CM | POA: Diagnosis not present

## 2019-03-13 DIAGNOSIS — O99342 Other mental disorders complicating pregnancy, second trimester: Secondary | ICD-10-CM | POA: Diagnosis not present

## 2019-03-13 DIAGNOSIS — Z833 Family history of diabetes mellitus: Secondary | ICD-10-CM | POA: Insufficient documentation

## 2019-03-13 DIAGNOSIS — Z87891 Personal history of nicotine dependence: Secondary | ICD-10-CM | POA: Insufficient documentation

## 2019-03-13 DIAGNOSIS — O368120 Decreased fetal movements, second trimester, not applicable or unspecified: Secondary | ICD-10-CM | POA: Insufficient documentation

## 2019-03-13 DIAGNOSIS — Z809 Family history of malignant neoplasm, unspecified: Secondary | ICD-10-CM | POA: Diagnosis not present

## 2019-03-13 DIAGNOSIS — F419 Anxiety disorder, unspecified: Secondary | ICD-10-CM | POA: Diagnosis not present

## 2019-03-13 DIAGNOSIS — Z91018 Allergy to other foods: Secondary | ICD-10-CM | POA: Diagnosis not present

## 2019-03-13 DIAGNOSIS — Z3A26 26 weeks gestation of pregnancy: Secondary | ICD-10-CM | POA: Diagnosis not present

## 2019-03-13 DIAGNOSIS — F329 Major depressive disorder, single episode, unspecified: Secondary | ICD-10-CM | POA: Insufficient documentation

## 2019-03-13 DIAGNOSIS — Z91040 Latex allergy status: Secondary | ICD-10-CM | POA: Diagnosis not present

## 2019-03-13 DIAGNOSIS — Z823 Family history of stroke: Secondary | ICD-10-CM | POA: Insufficient documentation

## 2019-03-13 DIAGNOSIS — O132 Gestational [pregnancy-induced] hypertension without significant proteinuria, second trimester: Secondary | ICD-10-CM | POA: Diagnosis not present

## 2019-03-13 DIAGNOSIS — Z818 Family history of other mental and behavioral disorders: Secondary | ICD-10-CM | POA: Diagnosis not present

## 2019-03-13 DIAGNOSIS — Z79899 Other long term (current) drug therapy: Secondary | ICD-10-CM | POA: Insufficient documentation

## 2019-03-13 NOTE — Discharge Instructions (Signed)
Fetal Movement Counts Patient Name: ________________________________________________ Patient Due Date: ____________________ What is a fetal movement count?  A fetal movement count is the number of times that you feel your baby move during a certain amount of time. This may also be called a fetal kick count. A fetal movement count is recommended for every pregnant woman. You may be asked to start counting fetal movements as early as week 28 of your pregnancy. Pay attention to when your baby is most active. You may notice your baby's sleep and wake cycles. You may also notice things that make your baby move more. You should do a fetal movement count:  When your baby is normally most active.  At the same time each day. A good time to count movements is while you are resting, after having something to eat and drink. How do I count fetal movements? 1. Find a quiet, comfortable area. Sit, or lie down on your side. 2. Write down the date, the start time and stop time, and the number of movements that you felt between those two times. Take this information with you to your health care visits. 3. Write down your start time when you feel the first movement. 4. Count kicks, flutters, swishes, rolls, and jabs. You should feel at least 10 movements. 5. You may stop counting after you have felt 10 movements, or if you have been counting for 2 hours. Write down the stop time. 6. If you do not feel 10 movements in 2 hours, contact your health care provider for further instructions. Your health care provider may want to do additional tests to assess your baby's well-being. Contact a health care provider if:  You feel fewer than 10 movements in 2 hours.  Your baby is not moving like he or she usually does. Date: ____________ Start time: ____________ Stop time: ____________ Movements: ____________ Date: ____________ Start time: ____________ Stop time: ____________ Movements: ____________ Date: ____________  Start time: ____________ Stop time: ____________ Movements: ____________ Date: ____________ Start time: ____________ Stop time: ____________ Movements: ____________ Date: ____________ Start time: ____________ Stop time: ____________ Movements: ____________ Date: ____________ Start time: ____________ Stop time: ____________ Movements: ____________ Date: ____________ Start time: ____________ Stop time: ____________ Movements: ____________ Date: ____________ Start time: ____________ Stop time: ____________ Movements: ____________ Date: ____________ Start time: ____________ Stop time: ____________ Movements: ____________ This information is not intended to replace advice given to you by your health care provider. Make sure you discuss any questions you have with your health care provider. Document Revised: 10/07/2018 Document Reviewed: 10/07/2018 Elsevier Patient Education  2020 Elsevier Inc.  

## 2019-03-13 NOTE — MAU Provider Note (Signed)
History   EI:[id number]   Chief Complaint  Patient presents with  . Decreased Fetal Movement    HPI Angelica [name] is a 34 y.o. female  G3P0110 here with report of decreased fetal movement since this morning. States she has felt baby moving but the movement aren't as strong as they've previously been. History of a [redacted]w[redacted]d delivery due to severe preeclampsia, baby died in the NICU. Very concerned with this pregnancy.  Denies contractions, LOF, or vaginal bleeding. Sees Dr. Pamala Hurry in the office every 2 weeks; sees her this coming week. Had a normal growth ultrasound at [facility] on 12/22 & has a follow up growth ultrasound on 1/19.  Patient's last menstrual period was 09/01/2018.  OB History  Gravida Para Term Preterm AB Living  3 1   1 1     SAB TAB Ectopic Multiple Live Births  0 1     1    # Outcome Date GA Lbr Len/2nd Weight Sex Delivery Anes PTL Lv  3 Current           2 Preterm 12/27/16 [redacted]w[redacted]d    CS-Classical  N ND     Complications: Chronic hypertension with superimposed preeclampsia  1 TAB             Past Medical History:  Diagnosis Date  . ADHD   . Allergy   . Anxiety   . Chronic hypertension   . Depression   . HELLP syndrome 2018  . Pregnancy induced hypertension   . Tracheoesophageal fistula (HCC)     Family History  Problem Relation Age of Onset  . Diabetes Maternal Grandfather   . Stroke Paternal Grandmother   . Hyperlipidemia Paternal Grandmother   . Mental illness Mother   . Mental illness Sister   . Cancer Maternal Grandmother     Social History   Socioeconomic History  . Marital status: Married    Spouse name: n/a  . Number of children: 0  . Years of education: Master's  . Highest education level: Not on file  Occupational History  . 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 Occupation: [employer]    Comment: private practice  Tobacco Use  . Smoking status: Former Research scientist (life sciences)  .  Smokeless tobacco: Never Used  Substance and Sexual Activity  . Alcohol use: Not Currently    Alcohol/week: 5.0 - 10.0 standard drinks    Types: 5 - 10 Standard drinks or equivalent per week  . Drug use: No  . Sexual activity: Yes    Partners: Male  Other Topics Concern  . Not on file  Social History Narrative   Graduated from [employer] 2008.   Lives with her fiance.   Engaged to be married in May 2017.      Social Determinants of Health   Financial Resource Strain:   . Difficulty of Paying Living Expenses: Not on file  Food Insecurity:   . Worried About Charity fundraiser in the Last Year: Not on file  . Ran Out of Food in the Last Year: Not on file  Transportation Needs:   . Lack of Transportation (Medical): Not on file  . Lack of Transportation (Non-Medical): Not on file  Physical Activity:   . Days of Exercise per Week: Not on file  . Minutes of Exercise per Session: Not on file  Stress:   . Feeling of Stress : Not on file  Social Connections:   . Frequency of Communication with Friends and Family: Not on file  . Frequency of Social Gatherings with Friends and Family: Not on file  . Attends Religious Services: Not on file  . Active Member of Clubs or Organizations: Not on file  . Attends Archivist Meetings: Not on file  . Marital Status: Not on file    Allergies  Allergen Reactions  . Latex Anaphylaxis and Hives  . Avocado     Sensitivity, not sure  . Banana Other (See Comments)    Sensitivity, not sure  . Kiwi Extract     Sensitivity, not sure    No current facility-administered medications on file prior to encounter.   Current Outpatient Medications on File Prior to Encounter  Medication Sig Dispense Refill  . aspirin 325 MG tablet Take 325 mg by mouth daily.    Marland Kitchen buPROPion (WELLBUTRIN XL) 300 MG 24 hr tablet Take 300 mg by mouth daily.    . busPIRone (BUSPAR) 15 MG tablet Take 15 mg by mouth 3 (three) times daily.    .  Cholecalciferol (VITAMIN D3 PO) Take by mouth.    . EPINEPHrine 0.3 mg/0.3 mL IJ SOAJ injection INJECT 0.3MLS INTO THE MUSCLE ONCE 1 Device 5  . folic acid (FOLATE) A999333 MCG tablet Take 400 mcg by mouth daily.    Marland Kitchen labetalol (NORMODYNE) 200 MG tablet TAKE 2 TABLETS BY MOUTH EVERY 8 HOURS. 180 tablet 8  . loratadine (CLARITIN) 10 MG tablet Take 10 mg by mouth daily as needed for allergies or rhinitis.    Riley Nearing w/o A-FE-Methfol-FA-DHA (PNV-DHA) 27-0.6-0.4-300 MG CAPS Take by mouth.    . sertraline (ZOLOFT) 100 MG tablet Take 2 tablets (200 mg total) by mouth daily. 60 tablet 2     Review of Systems  Constitutional: Negative.   Gastrointestinal: Negative.   Genitourinary: Negative.    Physical Exam   Vitals:   03/13/19 1218 03/13/19 1307  BP: 113/69 110/65  Pulse: 78 70  Resp: 20 20  TempSrc: Oral   SpO2: 100% 100%    Physical Exam  Nursing note and vitals reviewed. Constitutional: She appears well-developed and well-nourished.  Respiratory: Effort normal. No respiratory distress.  GI: Soft. There is no abdominal tenderness.  Skin: Skin is warm and dry.  Psychiatric: She has a normal mood and affect. Her behavior is normal. Judgment and thought content normal.   NST:  Baseline: 145 bpm, Variability: Good {> 6 bpm), Accelerations: Non-reactive but appropriate for gestational age and Decelerations: Absent  MAU Course  Procedures  MDM Normotensive in MAU  Category 1 fetal tracing. Fetal movement heard through monitor. Patient reports feeling movement today and while in MAU. She is very reassured by tracing. Discussed movement expectations at this gestation and for the remainder of the pregnancy.   Assessment and Plan  A: 1. Decreased fetal movements in second trimester, single or unspecified fetus   2. [redacted] weeks gestation of pregnancy    P: Discharge home Discussed reasons to return to MAU including fetal movement concerns & s/s preeclampsia Keep f/u with Dr.  Carmie Kanner, Junie Panning, NP 03/13/2019 4:27 PM

## 2019-03-13 NOTE — MAU Note (Signed)
CAMPBELL DEALE is a 34 y.o. at [redacted]w[redacted]d here in MAU reporting:  +decreased fetal movement Endorses feeling "little" movements but has not been as "strong" as it has been. Onset of complaint: last "normal" movement was 7am  Pain score: denies Vitals:   03/13/19 1218  BP: 113/69  Pulse: 78  Resp: 20  SpO2: 100%    FHT: 145 via efm Lab orders placed from triage: ua. Patient unable to leave sample at this time. She is very tearful.

## 2019-03-22 ENCOUNTER — Other Ambulatory Visit (HOSPITAL_COMMUNITY): Payer: Self-pay | Admitting: *Deleted

## 2019-03-22 ENCOUNTER — Ambulatory Visit (HOSPITAL_COMMUNITY): Payer: 59 | Admitting: *Deleted

## 2019-03-22 ENCOUNTER — Other Ambulatory Visit: Payer: Self-pay

## 2019-03-22 ENCOUNTER — Encounter (HOSPITAL_COMMUNITY): Payer: Self-pay

## 2019-03-22 ENCOUNTER — Ambulatory Visit (HOSPITAL_COMMUNITY)
Admission: RE | Admit: 2019-03-22 | Discharge: 2019-03-22 | Disposition: A | Discharge status: Home / Self Care | Payer: 59 | Source: Ambulatory Visit | Attending: Obstetrics and Gynecology | Admitting: Obstetrics and Gynecology

## 2019-03-22 VITALS — BP 103/60 | HR 85 | Temp 97.8°F

## 2019-03-22 DIAGNOSIS — O09293 Supervision of pregnancy with other poor reproductive or obstetric history, third trimester: Secondary | ICD-10-CM | POA: Diagnosis not present

## 2019-03-22 DIAGNOSIS — O0990 Supervision of high risk pregnancy, unspecified, unspecified trimester: Secondary | ICD-10-CM | POA: Insufficient documentation

## 2019-03-22 DIAGNOSIS — O26833 Pregnancy related renal disease, third trimester: Secondary | ICD-10-CM | POA: Insufficient documentation

## 2019-03-22 DIAGNOSIS — O09213 Supervision of pregnancy with history of pre-term labor, third trimester: Secondary | ICD-10-CM

## 2019-03-22 DIAGNOSIS — Z362 Encounter for other antenatal screening follow-up: Secondary | ICD-10-CM

## 2019-03-22 DIAGNOSIS — Z3A28 28 weeks gestation of pregnancy: Secondary | ICD-10-CM

## 2019-03-22 DIAGNOSIS — O2693 Pregnancy related conditions, unspecified, third trimester: Secondary | ICD-10-CM

## 2019-03-22 DIAGNOSIS — O34219 Maternal care for unspecified type scar from previous cesarean delivery: Secondary | ICD-10-CM

## 2019-04-19 ENCOUNTER — Ambulatory Visit (HOSPITAL_COMMUNITY)
Admission: RE | Admit: 2019-04-19 | Discharge: 2019-04-19 | Disposition: A | Discharge status: Home / Self Care | Payer: 59 | Source: Ambulatory Visit | Attending: Obstetrics and Gynecology | Admitting: Obstetrics and Gynecology

## 2019-04-19 ENCOUNTER — Encounter (HOSPITAL_COMMUNITY): Payer: Self-pay

## 2019-04-19 ENCOUNTER — Ambulatory Visit (HOSPITAL_COMMUNITY): Payer: 59 | Admitting: *Deleted

## 2019-04-19 ENCOUNTER — Other Ambulatory Visit (HOSPITAL_COMMUNITY): Payer: Self-pay | Admitting: *Deleted

## 2019-04-19 ENCOUNTER — Other Ambulatory Visit: Payer: Self-pay

## 2019-04-19 VITALS — BP 97/62 | HR 92 | Temp 97.3°F

## 2019-04-19 DIAGNOSIS — O10919 Unspecified pre-existing hypertension complicating pregnancy, unspecified trimester: Secondary | ICD-10-CM

## 2019-04-19 DIAGNOSIS — O0990 Supervision of high risk pregnancy, unspecified, unspecified trimester: Secondary | ICD-10-CM

## 2019-04-19 DIAGNOSIS — Z3A32 32 weeks gestation of pregnancy: Secondary | ICD-10-CM | POA: Diagnosis not present

## 2019-04-19 DIAGNOSIS — O09293 Supervision of pregnancy with other poor reproductive or obstetric history, third trimester: Secondary | ICD-10-CM

## 2019-04-25 ENCOUNTER — Other Ambulatory Visit: Payer: Self-pay | Admitting: Obstetrics

## 2019-04-26 ENCOUNTER — Ambulatory Visit (HOSPITAL_COMMUNITY): Payer: 59

## 2019-05-10 ENCOUNTER — Ambulatory Visit (HOSPITAL_COMMUNITY): Payer: 59 | Admitting: *Deleted

## 2019-05-10 ENCOUNTER — Other Ambulatory Visit (HOSPITAL_COMMUNITY): Payer: Self-pay | Admitting: Obstetrics

## 2019-05-10 ENCOUNTER — Ambulatory Visit (HOSPITAL_COMMUNITY)
Admission: RE | Admit: 2019-05-10 | Discharge: 2019-05-10 | Disposition: A | Discharge status: Home / Self Care | Payer: 59 | Source: Ambulatory Visit | Attending: Obstetrics and Gynecology | Admitting: Obstetrics and Gynecology

## 2019-05-10 ENCOUNTER — Other Ambulatory Visit: Payer: Self-pay

## 2019-05-10 ENCOUNTER — Encounter (HOSPITAL_COMMUNITY): Payer: Self-pay

## 2019-05-10 ENCOUNTER — Other Ambulatory Visit (HOSPITAL_COMMUNITY): Payer: Self-pay | Admitting: *Deleted

## 2019-05-10 VITALS — BP 106/70 | HR 83 | Temp 97.6°F

## 2019-05-10 DIAGNOSIS — O10919 Unspecified pre-existing hypertension complicating pregnancy, unspecified trimester: Secondary | ICD-10-CM

## 2019-05-10 DIAGNOSIS — O2690 Pregnancy related conditions, unspecified, unspecified trimester: Secondary | ICD-10-CM

## 2019-05-10 DIAGNOSIS — O365931 Maternal care for other known or suspected poor fetal growth, third trimester, fetus 1: Secondary | ICD-10-CM | POA: Diagnosis not present

## 2019-05-10 DIAGNOSIS — O09293 Supervision of pregnancy with other poor reproductive or obstetric history, third trimester: Secondary | ICD-10-CM

## 2019-05-10 DIAGNOSIS — O09219 Supervision of pregnancy with history of pre-term labor, unspecified trimester: Secondary | ICD-10-CM

## 2019-05-10 DIAGNOSIS — O0990 Supervision of high risk pregnancy, unspecified, unspecified trimester: Secondary | ICD-10-CM

## 2019-05-10 DIAGNOSIS — Z3A35 35 weeks gestation of pregnancy: Secondary | ICD-10-CM | POA: Diagnosis not present

## 2019-05-10 DIAGNOSIS — O09299 Supervision of pregnancy with other poor reproductive or obstetric history, unspecified trimester: Secondary | ICD-10-CM | POA: Diagnosis not present

## 2019-05-10 DIAGNOSIS — Z362 Encounter for other antenatal screening follow-up: Secondary | ICD-10-CM

## 2019-05-10 DIAGNOSIS — O34219 Maternal care for unspecified type scar from previous cesarean delivery: Secondary | ICD-10-CM

## 2019-05-12 ENCOUNTER — Encounter (HOSPITAL_COMMUNITY): Payer: Self-pay

## 2019-05-13 ENCOUNTER — Encounter (HOSPITAL_COMMUNITY): Payer: Self-pay

## 2019-05-13 NOTE — Patient Instructions (Addendum)
Angelica [name] [facility]  05/13/2019   Your procedure is scheduled on:  05/23/2019  Arrive at 1100 at Entrance C on [address] at [facility]  and [address]. You are invited to use the FREE valet parking or use the Visitor's parking deck.  Pick up the phone at the desk and dial [phone].  Call this number if you have problems the morning of surgery: [phone]  Remember:   Do not eat food:(After Midnight) Desps de medianoche.  Do not drink clear liquids: (After Midnight) Desps de medianoche.  Take these medicines the morning of surgery with A SIP OF WATER:  Take zoloft, wellbutrin buspar and labetalol as prescribed.   Do not wear jewelry, make-up or nail polish.  Do not wear lotions, powders, or perfumes. Do not wear deodorant.  Do not shave 48 hours prior to surgery.  Do not bring valuables to the hospital.  [facility] is not   responsible for any belongings or valuables brought to the hospital.  Contacts, dentures or bridgework may not be worn into surgery.  Leave suitcase in the car. After surgery it may be brought to your room.  For patients admitted to the hospital, checkout time is 11:00 AM the day of              discharge.      Please read over the following fact sheets that you were given:     Preparing for Surgery

## 2019-05-17 ENCOUNTER — Encounter (HOSPITAL_COMMUNITY): Payer: Self-pay | Admitting: *Deleted

## 2019-05-17 ENCOUNTER — Ambulatory Visit (HOSPITAL_COMMUNITY): Payer: 59 | Admitting: *Deleted

## 2019-05-17 ENCOUNTER — Other Ambulatory Visit: Payer: Self-pay

## 2019-05-17 ENCOUNTER — Ambulatory Visit (HOSPITAL_BASED_OUTPATIENT_CLINIC_OR_DEPARTMENT_OTHER)
Admission: RE | Admit: 2019-05-17 | Discharge: 2019-05-17 | Disposition: A | Discharge status: Home / Self Care | Payer: 59 | Source: Ambulatory Visit | Attending: Obstetrics | Admitting: Obstetrics

## 2019-05-17 VITALS — BP 120/76 | HR 80 | Temp 97.8°F

## 2019-05-17 DIAGNOSIS — O34219 Maternal care for unspecified type scar from previous cesarean delivery: Secondary | ICD-10-CM

## 2019-05-17 DIAGNOSIS — N189 Chronic kidney disease, unspecified: Secondary | ICD-10-CM

## 2019-05-17 DIAGNOSIS — O365930 Maternal care for other known or suspected poor fetal growth, third trimester, not applicable or unspecified: Secondary | ICD-10-CM

## 2019-05-17 DIAGNOSIS — O10013 Pre-existing essential hypertension complicating pregnancy, third trimester: Secondary | ICD-10-CM

## 2019-05-17 DIAGNOSIS — O09293 Supervision of pregnancy with other poor reproductive or obstetric history, third trimester: Secondary | ICD-10-CM

## 2019-05-17 DIAGNOSIS — I10 Essential (primary) hypertension: Secondary | ICD-10-CM | POA: Insufficient documentation

## 2019-05-17 DIAGNOSIS — Z3A36 36 weeks gestation of pregnancy: Secondary | ICD-10-CM

## 2019-05-17 DIAGNOSIS — O2693 Pregnancy related conditions, unspecified, third trimester: Secondary | ICD-10-CM

## 2019-05-19 ENCOUNTER — Inpatient Hospital Stay (HOSPITAL_COMMUNITY)
Admission: AD | Admit: 2019-05-19 | Discharge: 2019-05-19 | Disposition: A | Discharge status: Home / Self Care | Payer: 59 | Source: Home / Self Care | Attending: Obstetrics & Gynecology | Admitting: Obstetrics & Gynecology

## 2019-05-19 ENCOUNTER — Other Ambulatory Visit: Payer: Self-pay

## 2019-05-19 DIAGNOSIS — Z01812 Encounter for preprocedural laboratory examination: Secondary | ICD-10-CM | POA: Insufficient documentation

## 2019-05-19 DIAGNOSIS — Z20822 Contact with and (suspected) exposure to covid-19: Secondary | ICD-10-CM | POA: Insufficient documentation

## 2019-05-19 LAB — CBC
HCT: 40.6 % (ref 36.0–46.0)
Hemoglobin: 13.9 g/dL (ref 12.0–15.0)
MCH: 30.6 pg (ref 26.0–34.0)
MCHC: 34.2 g/dL (ref 30.0–36.0)
MCV: 89.4 fL (ref 80.0–100.0)
Platelets: 137 10*3/uL — ABNORMAL LOW (ref 150–400)
RBC: 4.54 MIL/uL (ref 3.87–5.11)
RDW: 13.2 % (ref 11.5–15.5)
WBC: 7.7 10*3/uL (ref 4.0–10.5)
nRBC: 0 % (ref 0.0–0.2)

## 2019-05-19 LAB — COMPREHENSIVE METABOLIC PANEL
ALT: 26 U/L (ref 0–44)
AST: 25 U/L (ref 15–41)
Albumin: 3.2 g/dL — ABNORMAL LOW (ref 3.5–5.0)
Alkaline Phosphatase: 123 U/L (ref 38–126)
Anion gap: 11 (ref 5–15)
BUN: 18 mg/dL (ref 6–20)
CO2: 22 mmol/L (ref 22–32)
Calcium: 9.3 mg/dL (ref 8.9–10.3)
Chloride: 102 mmol/L (ref 98–111)
Creatinine, Ser: 1.1 mg/dL — ABNORMAL HIGH (ref 0.44–1.00)
GFR calc Af Amer: >60 mL/min (ref >60)
GFR calc non Af Amer: >60 mL/min (ref >60)
Glucose, Bld: 92 mg/dL (ref 70–99)
Potassium: 4.4 mmol/L (ref 3.5–5.1)
Sodium: 135 mmol/L (ref 135–145)
Total Bilirubin: 0.7 mg/dL (ref 0.3–1.2)
Total Protein: 6.4 g/dL — ABNORMAL LOW (ref 6.5–8.1)

## 2019-05-19 LAB — TYPE AND SCREEN
ABO/RH(D): O NEG
Antibody Screen: POSITIVE

## 2019-05-19 LAB — URIC ACID: Uric Acid, Serum: 7.6 mg/dL — ABNORMAL HIGH (ref 2.5–7.1)

## 2019-05-19 LAB — RESPIRATORY PANEL BY RT PCR (FLU A&B, COVID)
Influenza A by PCR: NEGATIVE
Influenza B by PCR: NEGATIVE
SARS Coronavirus 2 by RT PCR: NEGATIVE

## 2019-05-19 NOTE — H&P (Signed)
Angelica [name] is a 34 y.o. G3P0110 at [redacted]w[redacted]d presenting for RCS. Pt notes rare contractions. Good fetal movement, No vaginal bleeding, not leaking fluid. Pt with no current HA or scotomata but does have intermittent episodes epigastric pain with mild current RUQ pain. Given h/o severe, early PEC, labs for PEC have been monitored. Labs on 3/18 show acute increase in Cr (1.09 from .98 over the last few weeks), stable drop in plts (115 from baseline of 150s), new elevation to Uric Acid (7.7 from baseline 4.1), new protein on UA dip and slow rise in AST/ALT (21/21 from baseline 11/13).   Pt with chronic kidney disease, stage 2, with exacerbation after severe PEC in her last pregnancy, which ended with a classical c/s at 25 wks for worsening maternal/ fetal factors and neonatal death on day of life #2.   [facility] at [place] since 7 wks - Dated by 7 wk u/s, NCWD - Chronic kidney disease and chronic hypertension. Followed this pregnancy by nephrology and MFM. Started on labetalol 200 mg bid, needed to drop to 100mg  tid in 2nd trimester due to symptomatic hypotension. bp's have remained 120s/70s throughout pregnancy. PEC labs have been followed due to her history with recent worsening as above. - MTHFR heterozygous, thrombophilia w/u otherwise negative, Was ordered given the h/o severe early PEC. Pt on folic acid through pregnancy. - Anxiety, depression. Exacerbated by neonatal demise last pregnancy. Follows with psychiatry and therapist. On Wellbutrin and Buspar. - gestational thrombocytopenia. Starting plts 150, dropped and stabilized around 115 over the last 3 wks of pregnancy.  - O neg, s/p Rhogam.  - IUGR. U/s at 35 wks, 4'12/ 8%. Since then weekly BPP and NST which have been reassurring. U/s 3/16 with nl AFI, nl UA dopplers, BPP 8/8.  - prior classical c/s. Planning RCS.    Prenatal Transfer Tool  Maternal Diabetes: No Genetic Screening: Normal Maternal Ultrasounds/Referrals: IUGR Fetal  Ultrasounds or other Referrals:  Referred to Materal Fetal Medicine  Maternal Substance Abuse:  No Significant Maternal Medications:  Meds include: Other:  Significant Maternal Lab Results: Group B Strep negative     OB History    Gravida  3   Para  1   Term      Preterm  1   AB  1   Living        SAB  0   TAB  1   Ectopic      Multiple      Live Births  1          Past Medical History:  Diagnosis Date  . ADHD   . Allergy   . Anxiety   . Chronic hypertension   . Chronic kidney disease   . Depression   . HELLP syndrome 2018  . History of pre-eclampsia in prior pregnancy, currently pregnant   . HPV (human papilloma virus) anogenital infection   . Pregnancy induced hypertension   . Tracheoesophageal fistula ([place])   . Vaginal Pap smear, abnormal    Past Surgical History:  Procedure Laterality Date  . CESAREAN SECTION N/A 12/27/2016   Procedure: CESAREAN SECTION;  Surgeon: Aloha Gell, MD;  Location: [facility];  Service: Obstetrics;  Laterality: N/A;  . DILATION AND CURETTAGE OF UTERUS  01/2013  . ESOPHAGUS SURGERY    . TRACHEAL SURGERY    . TRACHEOESOPHAGEAL FISTULA REPAIR    . VASCULAR RING REPAIR     Family History: family history includes Cancer in her maternal grandmother;  Dementia in her paternal grandmother; Diabetes in her maternal grandfather; Hyperlipidemia in her paternal grandmother; Mental illness in her mother and sister; Stroke in her paternal grandmother. Social History:  reports that she has quit smoking. She has never used smokeless tobacco. She reports previous alcohol use of about 5.0 - 10.0 standard drinks of alcohol per week. She reports that she does not use drugs.  Review of Systems - Negative except anxiety     Last menstrual period 09/01/2018, unknown if currently breastfeeding.  Physical Exam:  Vitals:   05/20/19 1221  BP: 129/90  Temp: 98.1 F (36.7 C)  Height: 5\' 5"  (1.651 m)  Weight: 69.4 kg  TempSrc:  Oral  BMI (Calculated): 25.46    Gen: well appearing, no distress Abd: gravid, NT, no RUQ pain LE: no edema, equal bilaterally, non-tender. 4+ DTR   Prenatal labs: ABO, Rh: --/--/O NEG (03/18 1919) Antibody: POS (03/18 1919) Rubella: Immune (08/03 0000) RPR: Nonreactive (08/03 0000)  HBsAg: Negative (08/03 0000)  HIV: Non-reactive (08/03 0000)  GBS:   neg 1 hr Glucola 65  Genetic screening normal Panorama, nl AFP, nl Horizon Anatomy US normal  CBC Latest Ref Rng & Units 05/20/2019 05/19/2019 02/20/2019  WBC 4.0 - 10.5 K/uL 7.3 7.7 8.9  Hemoglobin 12.0 - 15.0 g/dL 14.2 13.9 12.7  Hematocrit 36.0 - 46.0 % 41.9 40.6 36.2  Platelets 150 - 400 K/uL 125(L) 137(L) 163   CMP     Component Value Date/Time   NA 135 05/19/2019 1919   NA 141 01/08/2015 0000   K 4.4 05/19/2019 1919   CL 102 05/19/2019 1919   CO2 22 05/19/2019 1919   GLUCOSE 92 05/19/2019 1919   BUN 18 05/19/2019 1919   BUN 12 01/08/2015 0000   CREATININE 1.10 (H) 05/19/2019 1919   CALCIUM 9.3 05/19/2019 1919   PROT 6.4 (L) 05/19/2019 1919   ALBUMIN 3.2 (L) 05/19/2019 1919   AST 25 05/19/2019 1919   ALT 26 05/19/2019 1919   ALKPHOS 123 05/19/2019 1919   BILITOT 0.7 05/19/2019 [place] >60 05/19/2019 1919   GFRAA >60 05/19/2019 1919      Assessment/Plan: 34 y.o. G3P0110 at [redacted]w[redacted]d - Chronic htn, chronic kidney diease with new epigastric pain, IUGR and lab abnormalities concerning for developing PEC. Labs changes along with IUGR in this high risk pt prompting early delivery after consulation with Dr. Annamaria Boots of MFM.  - Prior classical c/s, RCS - thrombocytopenia - depression/ anxiety, cont meds, consider SW consult    [name] 05/20/2019 1:36 PM

## 2019-05-19 NOTE — MAU Note (Signed)
Pt's c/s date moved up to 3/19.  Here for pre-procedural labs and covid testing. Scrub and instructions given.  Lab called.

## 2019-05-20 ENCOUNTER — Encounter (HOSPITAL_COMMUNITY): Payer: Self-pay | Admitting: Obstetrics

## 2019-05-20 ENCOUNTER — Inpatient Hospital Stay (HOSPITAL_COMMUNITY)
Admission: RE | Admit: 2019-05-20 | Discharge: 2019-05-23 | DRG: 787 | Disposition: A | Discharge status: Home / Self Care | Payer: 59 | Attending: Obstetrics | Admitting: Obstetrics

## 2019-05-20 ENCOUNTER — Encounter (HOSPITAL_COMMUNITY)
Admission: RE | Disposition: A | Discharge status: Home / Self Care | Payer: Self-pay | Source: Home / Self Care | Attending: Obstetrics

## 2019-05-20 ENCOUNTER — Inpatient Hospital Stay (HOSPITAL_COMMUNITY): Payer: 59 | Admitting: Anesthesiology

## 2019-05-20 DIAGNOSIS — N182 Chronic kidney disease, stage 2 (mild): Secondary | ICD-10-CM | POA: Diagnosis present

## 2019-05-20 DIAGNOSIS — O365930 Maternal care for other known or suspected poor fetal growth, third trimester, not applicable or unspecified: Secondary | ICD-10-CM | POA: Diagnosis present

## 2019-05-20 DIAGNOSIS — Z20822 Contact with and (suspected) exposure to covid-19: Secondary | ICD-10-CM | POA: Diagnosis present

## 2019-05-20 DIAGNOSIS — Z87891 Personal history of nicotine dependence: Secondary | ICD-10-CM | POA: Diagnosis not present

## 2019-05-20 DIAGNOSIS — F329 Major depressive disorder, single episode, unspecified: Secondary | ICD-10-CM | POA: Diagnosis present

## 2019-05-20 DIAGNOSIS — O114 Pre-existing hypertension with pre-eclampsia, complicating childbirth: Secondary | ICD-10-CM | POA: Diagnosis present

## 2019-05-20 DIAGNOSIS — D6959 Other secondary thrombocytopenia: Secondary | ICD-10-CM | POA: Diagnosis present

## 2019-05-20 DIAGNOSIS — I129 Hypertensive chronic kidney disease with stage 1 through stage 4 chronic kidney disease, or unspecified chronic kidney disease: Secondary | ICD-10-CM | POA: Diagnosis present

## 2019-05-20 DIAGNOSIS — O9912 Other diseases of the blood and blood-forming organs and certain disorders involving the immune mechanism complicating childbirth: Secondary | ICD-10-CM | POA: Diagnosis present

## 2019-05-20 DIAGNOSIS — O10919 Unspecified pre-existing hypertension complicating pregnancy, unspecified trimester: Secondary | ICD-10-CM | POA: Diagnosis present

## 2019-05-20 DIAGNOSIS — Z3A36 36 weeks gestation of pregnancy: Secondary | ICD-10-CM | POA: Diagnosis not present

## 2019-05-20 DIAGNOSIS — Z98891 History of uterine scar from previous surgery: Secondary | ICD-10-CM

## 2019-05-20 DIAGNOSIS — O99344 Other mental disorders complicating childbirth: Secondary | ICD-10-CM | POA: Diagnosis present

## 2019-05-20 DIAGNOSIS — F419 Anxiety disorder, unspecified: Secondary | ICD-10-CM | POA: Diagnosis present

## 2019-05-20 DIAGNOSIS — O34212 Maternal care for vertical scar from previous cesarean delivery: Principal | ICD-10-CM | POA: Diagnosis present

## 2019-05-20 DIAGNOSIS — O1022 Pre-existing hypertensive chronic kidney disease complicating childbirth: Secondary | ICD-10-CM | POA: Diagnosis present

## 2019-05-20 DIAGNOSIS — N181 Chronic kidney disease, stage 1: Secondary | ICD-10-CM

## 2019-05-20 LAB — CBC
HCT: 41.9 % (ref 36.0–46.0)
Hemoglobin: 14.2 g/dL (ref 12.0–15.0)
MCH: 31.1 pg (ref 26.0–34.0)
MCHC: 33.9 g/dL (ref 30.0–36.0)
MCV: 91.9 fL (ref 80.0–100.0)
Platelets: 125 10*3/uL — ABNORMAL LOW (ref 150–400)
RBC: 4.56 MIL/uL (ref 3.87–5.11)
RDW: 13.2 % (ref 11.5–15.5)
WBC: 7.3 10*3/uL (ref 4.0–10.5)
nRBC: 0 % (ref 0.0–0.2)

## 2019-05-20 LAB — RPR: RPR Ser Ql: NONREACTIVE

## 2019-05-20 SURGERY — Surgical Case
Anesthesia: Spinal | Site: Abdomen | Wound class: Clean Contaminated

## 2019-05-20 MED ORDER — NALBUPHINE HCL 10 MG/ML IJ SOLN: 5.0000 mg | INTRAMUSCULAR | Status: DC | PRN

## 2019-05-20 MED ORDER — FENTANYL CITRATE (PF) 100 MCG/2ML IJ SOLN: 25.0000 ug | INTRAMUSCULAR | Status: DC | PRN

## 2019-05-20 MED ORDER — ZOLPIDEM TARTRATE 5 MG PO TABS: 5.0000 mg | ORAL_TABLET | Freq: Every evening | ORAL | Status: DC | PRN

## 2019-05-20 MED ORDER — FAMOTIDINE 20 MG PO TABS
20.0000 mg | ORAL_TABLET | Freq: Once | ORAL | Status: AC
  Administered 2019-05-20: 20 mg via ORAL

## 2019-05-20 MED ORDER — OXYCODONE-ACETAMINOPHEN 5-325 MG PO TABS: 1.0000 | ORAL_TABLET | ORAL | Status: DC | PRN

## 2019-05-20 MED ORDER — ONDANSETRON HCL 4 MG/2ML IJ SOLN
INTRAMUSCULAR | Status: AC
  Filled 2019-05-20: qty 2

## 2019-05-20 MED ORDER — DIPHENHYDRAMINE HCL 50 MG/ML IJ SOLN: 12.5000 mg | INTRAMUSCULAR | Status: DC | PRN

## 2019-05-20 MED ORDER — FAMOTIDINE 20 MG PO TABS
ORAL_TABLET | ORAL | Status: AC
  Filled 2019-05-20: qty 1

## 2019-05-20 MED ORDER — CEFAZOLIN SODIUM-DEXTROSE 2-4 GM/100ML-% IV SOLN
INTRAVENOUS | Status: AC
  Filled 2019-05-20: qty 100

## 2019-05-20 MED ORDER — CEFAZOLIN SODIUM-DEXTROSE 2-3 GM-%(50ML) IV SOLR
INTRAVENOUS | Status: DC | PRN
  Administered 2019-05-20: 2 g via INTRAVENOUS

## 2019-05-20 MED ORDER — SIMETHICONE 80 MG PO CHEW
80.0000 mg | CHEWABLE_TABLET | Freq: Three times a day (TID) | ORAL | Status: DC
  Administered 2019-05-20 – 2019-05-23 (×8): 80 mg via ORAL
  Filled 2019-05-20 (×8): qty 1

## 2019-05-20 MED ORDER — SOD CITRATE-CITRIC ACID 500-334 MG/5ML PO SOLN
ORAL | Status: AC
  Filled 2019-05-20: qty 30

## 2019-05-20 MED ORDER — SENNOSIDES-DOCUSATE SODIUM 8.6-50 MG PO TABS
2.0000 | ORAL_TABLET | ORAL | Status: DC
  Administered 2019-05-20 – 2019-05-22 (×3): 2 via ORAL
  Filled 2019-05-20 (×3): qty 2

## 2019-05-20 MED ORDER — SERTRALINE HCL 100 MG PO TABS
200.0000 mg | ORAL_TABLET | Freq: Every day | ORAL | Status: DC
  Administered 2019-05-21 – 2019-05-23 (×3): 200 mg via ORAL
  Filled 2019-05-20 (×4): qty 2

## 2019-05-20 MED ORDER — DIPHENHYDRAMINE HCL 25 MG PO CAPS: 25.0000 mg | ORAL_CAPSULE | Freq: Four times a day (QID) | ORAL | Status: DC | PRN

## 2019-05-20 MED ORDER — SCOPOLAMINE 1 MG/3DAYS TD PT72
MEDICATED_PATCH | TRANSDERMAL | Status: AC
  Filled 2019-05-20: qty 1

## 2019-05-20 MED ORDER — SODIUM CHLORIDE 0.9% FLUSH: 3.0000 mL | INTRAVENOUS | Status: DC | PRN

## 2019-05-20 MED ORDER — NALBUPHINE HCL 10 MG/ML IJ SOLN: 5.0000 mg | Freq: Once | INTRAMUSCULAR | Status: DC | PRN

## 2019-05-20 MED ORDER — PHENYLEPHRINE HCL-NACL 20-0.9 MG/250ML-% IV SOLN
INTRAVENOUS | Status: DC | PRN
  Administered 2019-05-20: 40 ug/min via INTRAVENOUS

## 2019-05-20 MED ORDER — LACTATED RINGERS IV SOLN: INTRAVENOUS | Status: DC

## 2019-05-20 MED ORDER — SCOPOLAMINE 1 MG/3DAYS TD PT72
1.0000 | MEDICATED_PATCH | Freq: Once | TRANSDERMAL | Status: DC
  Administered 2019-05-20: 1.5 mg via TRANSDERMAL

## 2019-05-20 MED ORDER — SIMETHICONE 80 MG PO CHEW: 80.0000 mg | CHEWABLE_TABLET | ORAL | Status: DC | PRN

## 2019-05-20 MED ORDER — OXYTOCIN 40 UNITS IN NORMAL SALINE INFUSION - SIMPLE MED
INTRAVENOUS | Status: DC | PRN
  Administered 2019-05-20: 40 [IU] via INTRAVENOUS

## 2019-05-20 MED ORDER — BUPIVACAINE IN DEXTROSE 0.75-8.25 % IT SOLN
INTRATHECAL | Status: DC | PRN
  Administered 2019-05-20: 1.6 mL via INTRATHECAL

## 2019-05-20 MED ORDER — PROMETHAZINE HCL 25 MG/ML IJ SOLN: 6.2500 mg | INTRAMUSCULAR | Status: DC | PRN

## 2019-05-20 MED ORDER — OXYTOCIN 40 UNITS IN NORMAL SALINE INFUSION - SIMPLE MED
INTRAVENOUS | Status: AC
  Filled 2019-05-20: qty 1000

## 2019-05-20 MED ORDER — WITCH HAZEL-GLYCERIN EX PADS: 1.0000 "application " | MEDICATED_PAD | CUTANEOUS | Status: DC | PRN

## 2019-05-20 MED ORDER — CEFAZOLIN SODIUM-DEXTROSE 2-4 GM/100ML-% IV SOLN: 2.0000 g | INTRAVENOUS | Status: DC

## 2019-05-20 MED ORDER — NALOXONE HCL 4 MG/10ML IJ SOLN
1.0000 ug/kg/h | INTRAVENOUS | Status: DC | PRN
  Filled 2019-05-20: qty 5

## 2019-05-20 MED ORDER — COCONUT OIL OIL
1.0000 | TOPICAL_OIL | Status: DC | PRN
  Administered 2019-05-21: 1 via TOPICAL

## 2019-05-20 MED ORDER — FENTANYL CITRATE (PF) 100 MCG/2ML IJ SOLN
INTRAMUSCULAR | Status: AC
  Filled 2019-05-20: qty 2

## 2019-05-20 MED ORDER — LACTATED RINGERS IV SOLN: INTRAVENOUS | Status: DC | PRN

## 2019-05-20 MED ORDER — ONDANSETRON HCL 4 MG/2ML IJ SOLN: 4.0000 mg | Freq: Three times a day (TID) | INTRAMUSCULAR | Status: DC | PRN

## 2019-05-20 MED ORDER — MENTHOL 3 MG MT LOZG: 1.0000 | LOZENGE | OROMUCOSAL | Status: DC | PRN

## 2019-05-20 MED ORDER — SCOPOLAMINE 1 MG/3DAYS TD PT72: 1.0000 | MEDICATED_PATCH | Freq: Once | TRANSDERMAL | Status: DC

## 2019-05-20 MED ORDER — DIPHENHYDRAMINE HCL 25 MG PO CAPS
25.0000 mg | ORAL_CAPSULE | ORAL | Status: DC | PRN
  Administered 2019-05-20 – 2019-05-21 (×2): 25 mg via ORAL
  Filled 2019-05-20 (×2): qty 1

## 2019-05-20 MED ORDER — MORPHINE SULFATE (PF) 0.5 MG/ML IJ SOLN
INTRAMUSCULAR | Status: DC | PRN
  Administered 2019-05-20: .15 mg via INTRATHECAL

## 2019-05-20 MED ORDER — ONDANSETRON HCL 4 MG/2ML IJ SOLN
INTRAMUSCULAR | Status: DC | PRN
  Administered 2019-05-20: 4 mg via INTRAVENOUS

## 2019-05-20 MED ORDER — BUSPIRONE HCL 15 MG PO TABS
15.0000 mg | ORAL_TABLET | Freq: Two times a day (BID) | ORAL | Status: DC
  Administered 2019-05-20 – 2019-05-23 (×6): 15 mg via ORAL
  Filled 2019-05-20 (×7): qty 1

## 2019-05-20 MED ORDER — PRENATAL MULTIVITAMIN CH
1.0000 | ORAL_TABLET | Freq: Every day | ORAL | Status: DC
  Administered 2019-05-21 – 2019-05-23 (×3): 1 via ORAL
  Filled 2019-05-20 (×2): qty 1

## 2019-05-20 MED ORDER — STERILE WATER FOR IRRIGATION IR SOLN
Status: DC | PRN
Start: 2019-05-20 — End: 2019-05-20
  Administered 2019-05-20: 1

## 2019-05-20 MED ORDER — DIBUCAINE (PERIANAL) 1 % EX OINT: 1.0000 "application " | TOPICAL_OINTMENT | CUTANEOUS | Status: DC | PRN

## 2019-05-20 MED ORDER — OXYTOCIN 40 UNITS IN NORMAL SALINE INFUSION - SIMPLE MED: 2.5000 [IU]/h | INTRAVENOUS | Status: AC

## 2019-05-20 MED ORDER — SIMETHICONE 80 MG PO CHEW
80.0000 mg | CHEWABLE_TABLET | ORAL | Status: DC
  Administered 2019-05-20 – 2019-05-22 (×3): 80 mg via ORAL
  Filled 2019-05-20 (×3): qty 1

## 2019-05-20 MED ORDER — PHENYLEPHRINE HCL-NACL 20-0.9 MG/250ML-% IV SOLN
INTRAVENOUS | Status: AC
  Filled 2019-05-20: qty 250

## 2019-05-20 MED ORDER — ACETAMINOPHEN 500 MG PO TABS
1000.0000 mg | ORAL_TABLET | Freq: Four times a day (QID) | ORAL | Status: AC
  Administered 2019-05-20 – 2019-05-21 (×4): 1000 mg via ORAL
  Filled 2019-05-20 (×4): qty 2

## 2019-05-20 MED ORDER — TETANUS-DIPHTH-ACELL PERTUSSIS 5-2.5-18.5 LF-MCG/0.5 IM SUSP: 0.5000 mL | Freq: Once | INTRAMUSCULAR | Status: DC

## 2019-05-20 MED ORDER — FENTANYL CITRATE (PF) 100 MCG/2ML IJ SOLN
INTRAMUSCULAR | Status: DC | PRN
  Administered 2019-05-20: 15 ug via INTRATHECAL

## 2019-05-20 MED ORDER — NALOXONE HCL 0.4 MG/ML IJ SOLN: 0.4000 mg | INTRAMUSCULAR | Status: DC | PRN

## 2019-05-20 MED ORDER — SODIUM CHLORIDE 0.9 % IR SOLN
Status: DC | PRN
  Administered 2019-05-20: 1

## 2019-05-20 MED ORDER — MEPERIDINE HCL 25 MG/ML IJ SOLN: 6.2500 mg | INTRAMUSCULAR | Status: DC | PRN

## 2019-05-20 MED ORDER — SOD CITRATE-CITRIC ACID 500-334 MG/5ML PO SOLN
30.0000 mL | Freq: Once | ORAL | Status: AC
  Administered 2019-05-20: 30 mL via ORAL

## 2019-05-20 MED ORDER — IBUPROFEN 800 MG PO TABS
800.0000 mg | ORAL_TABLET | Freq: Three times a day (TID) | ORAL | Status: DC
  Administered 2019-05-23: 800 mg via ORAL
  Filled 2019-05-20: qty 1

## 2019-05-20 MED ORDER — MORPHINE SULFATE (PF) 0.5 MG/ML IJ SOLN
INTRAMUSCULAR | Status: AC
  Filled 2019-05-20: qty 10

## 2019-05-20 MED ORDER — BUPROPION HCL ER (XL) 300 MG PO TB24
300.0000 mg | ORAL_TABLET | Freq: Every day | ORAL | Status: DC
  Administered 2019-05-21 – 2019-05-23 (×3): 300 mg via ORAL
  Filled 2019-05-20 (×3): qty 1

## 2019-05-20 MED ORDER — SODIUM CHLORIDE 0.9 % IV SOLN: INTRAVENOUS | Status: DC | PRN

## 2019-05-20 SURGICAL SUPPLY — 43 items
APL SKNCLS STERI-STRIP NONHPOA (GAUZE/BANDAGES/DRESSINGS) ×1
BENZOIN TINCTURE PRP APPL 2/3 (GAUZE/BANDAGES/DRESSINGS) ×2 IMPLANT
CHLORAPREP W/TINT 26ML (MISCELLANEOUS) ×3 IMPLANT
CLAMP CORD UMBIL (MISCELLANEOUS) IMPLANT
CLOSURE STERI STRIP 1/2 X4 (GAUZE/BANDAGES/DRESSINGS) ×2 IMPLANT
CLOSURE WOUND 1/2 X4 (GAUZE/BANDAGES/DRESSINGS)
CLOTH BEACON ORANGE TIMEOUT ST (SAFETY) ×3 IMPLANT
DRSG OPSITE POSTOP 4X10 (GAUZE/BANDAGES/DRESSINGS) ×3 IMPLANT
ELECT REM PT RETURN 9FT ADLT (ELECTROSURGICAL) ×3
ELECTRODE REM PT RTRN 9FT ADLT (ELECTROSURGICAL) ×1 IMPLANT
EXTRACTOR VACUUM M CUP 4 TUBE (SUCTIONS) IMPLANT
EXTRACTOR VACUUM M CUP 4' TUBE (SUCTIONS)
GAUZE SPONGE 4X4 12PLY STRL LF (GAUZE/BANDAGES/DRESSINGS) ×4 IMPLANT
GLOVE BIO SURGEON STRL SZ 6.5 (GLOVE) ×2 IMPLANT
GLOVE BIO SURGEONS STRL SZ 6.5 (GLOVE) ×1
GLOVE BIOGEL PI IND STRL 7.0 (GLOVE) ×2 IMPLANT
GLOVE BIOGEL PI INDICATOR 7.0 (GLOVE) ×4
GOWN STRL REUS W/TWL LRG LVL3 (GOWN DISPOSABLE) ×6 IMPLANT
KIT ABG SYR 3ML LUER SLIP (SYRINGE) IMPLANT
NDL HYPO 25X5/8 SAFETYGLIDE (NEEDLE) IMPLANT
NEEDLE HYPO 22GX1.5 SAFETY (NEEDLE) IMPLANT
NEEDLE HYPO 25X5/8 SAFETYGLIDE (NEEDLE) ×3 IMPLANT
NS IRRIG 1000ML POUR BTL (IV SOLUTION) ×3 IMPLANT
PACK C SECTION WH (CUSTOM PROCEDURE TRAY) ×3 IMPLANT
PAD ABD 7.5X8 STRL (GAUZE/BANDAGES/DRESSINGS) ×2 IMPLANT
PAD OB MATERNITY 4.3X12.25 (PERSONAL CARE ITEMS) ×3 IMPLANT
PENCIL SMOKE EVAC W/HOLSTER (ELECTROSURGICAL) ×3 IMPLANT
STAPLER VISISTAT 35W (STAPLE) ×2 IMPLANT
STRIP CLOSURE SKIN 1/2X4 (GAUZE/BANDAGES/DRESSINGS) IMPLANT
SUT MON AB 4-0 PS1 27 (SUTURE) ×3 IMPLANT
SUT PLAIN 0 NONE (SUTURE) IMPLANT
SUT PLAIN 2 0 (SUTURE) ×3
SUT PLAIN 2 0 XLH (SUTURE) IMPLANT
SUT PLAIN ABS 2-0 CT1 27XMFL (SUTURE) IMPLANT
SUT VIC AB 0 CT1 36 (SUTURE) ×6 IMPLANT
SUT VIC AB 0 CTX 36 (SUTURE) ×9
SUT VIC AB 0 CTX36XBRD ANBCTRL (SUTURE) ×2 IMPLANT
SUT VIC AB 2-0 CT1 27 (SUTURE) ×3
SUT VIC AB 2-0 CT1 TAPERPNT 27 (SUTURE) ×1 IMPLANT
SYR CONTROL 10ML LL (SYRINGE) ×2 IMPLANT
TOWEL OR 17X24 6PK STRL BLUE (TOWEL DISPOSABLE) ×3 IMPLANT
TRAY FOLEY W/BAG SLVR 14FR LF (SET/KITS/TRAYS/PACK) IMPLANT
WATER STERILE IRR 1000ML POUR (IV SOLUTION) ×3 IMPLANT

## 2019-05-20 NOTE — Op Note (Signed)
05/20/2019  3:45 PM  PATIENT:  Angelica [name]  34 y.o. female  PRE-OPERATIVE DIAGNOSIS:  History of Classical Cesarean Section, chronic hypertension with onset of pre-eclampsia  POST-OPERATIVE DIAGNOSIS:  History of Classical Cesarean Section, chronic hypertension with onset of pre-eclampsia  PROCEDURE:  Procedure(s) with comments: Repeat CESAREAN SECTION (N/A) - EDD: 06/13/19 Low transverse cesarean section with 2 layer closure Vacuum-assisted cesarean delivery  SURGEON:  Surgeon(s) and Role:    Aloha Gell, MD - Primary  PHYSICIAN ASSISTANT: Surgical tech  ASSISTANTS: none   ANESTHESIA:   spinal  EBL:  100 mL   BLOOD ADMINISTERED:none  DRAINS: Urinary Catheter (Foley)   LOCAL MEDICATIONS USED:  NONE  SPECIMEN:  Source of Specimen:  Placenta  DISPOSITION OF SPECIMEN:  PATHOLOGY  COUNTS:  YES  TOURNIQUET:  * No tourniquets in log *  DICTATION: .Note written in EPIC  PLAN OF CARE: Admit to inpatient   PATIENT DISPOSITION:  PACU - hemodynamically stable.   Delay start of Pharmacological VTE agent (>24hrs) due to surgical blood loss or risk of bleeding: yes    Findings:  @BABYSEXEBC @ infant,  APGAR (1 MIN): 6   APGAR (5 MINS): 9   APGAR (10 MINS):   Normal uterus, tubes and ovaries, normal placenta. 3VC, clear amniotic fluid  EBL: 100 cc Antibiotics:   2g Ancef Complications: none  Indications: This is a 34 y.o. year-old, G2 P0-1-0-0 at [redacted]w[redacted]d admitted for repeat cesarean section.  Cesarean section originally scheduled at 37 weeks for history of classical uterine incision but moved to today due to change in creatinine and LFTs in the setting of history of severe preeclampsia.  Patient also with new onset right upper quadrant and epigastric pain. Risks benefits and alternatives of the procedure were discussed with the patient who agreed to proceed  Procedure:  After informed consent was obtained the patient was taken to the operating room where spinal  anesthesia was initiated.  She was prepped and draped in the normal sterile fashion in dorsal supine position with a leftward tilt.  A foley catheter was in place.  A Pfannenstiel skin incision was made 2 cm above the pubic symphysis in the midline with the scalpel, over the prior Pfannenstiel scar . Dissection was carried down with the Bovie cautery until the fascia was reached.  Of note the fascia was quite thick and scarred. the fascia was incised in the midline. The incision was extended laterally with the Mayo scissors.  Some bleeding was controlled with cautery the inferior aspect of the fascial incision was grasped with the Coker clamps, elevated up and the underlying rectus muscles were dissected off sharply. The superior aspect of the fascial incision was grasped with the Coker clamps elevated up and the underlying rectus muscles were dissected off sharply.  The peritoneum was entered bluntly. The peritoneal incision was extended superiorly and inferiorly with good visualization of the bladder. The bladder blade was inserted and palpation was done to assess the fetal position and the location of the uterine vessels. The lower segment of the uterus was incised sharply with the scalpel and extended  bluntly in the cephalo-caudal fashion.  Given the thick nonlaboring lower uterine segment bandage scissors were used bilaterally in the angles to extend the incision.  The infant was grasped, brought to the incision,  rotated but the head did not deliver easily.  After about 1 minute of attempt the decision was made to place a Bell vacuum on the vertex to help guide the  head out through the uterine incision.  Suction time was approximately 10 seconds.  Once the head was guided through the incision suction was removed and the remainder of the infant was delivered easily .    The cord was clamped and cut after 1 minute delay. The infant was handed off to the waiting pediatrician. The placenta was expressed. The  uterus was unable to be exteriorized. The uterus was cleared of all clots and debris. The uterine incision was repaired with 0 Vicryl in a running locked fashion.  A second layer of the same suture was used in an imbricating fashion to obtain excellent hemostasis.  2 additional figure-of-eight sutures were placed in the midpoint of the incision for hemostasis.  The tubes and ovaries were evaluated and felt to be normal . the uterine incision was reinspected and found to be hemostatic. The peritoneum was grasped and closed with 2-0 Vicryl in a running fashion. The cut muscle edges and the underside of the fascia were inspected and found to be hemostatic. The fascia was closed with 0 Vicryl in a single layer. The subcutaneous tissue was irrigated. Scarpa's layer was closed with a 2-0 plain gut suture. The skin was closed with staples.  The patient tolerated the procedure well. Sponge lap and needle counts were correct x3 and patient was taken to the recovery room in a stable condition.  [name] 05/20/2019 3:46 PM

## 2019-05-20 NOTE — Anesthesia Procedure Notes (Signed)
Spinal  Patient location during procedure: OR Start time: 05/20/2019 1:54 PM End time: 05/20/2019 1:57 PM Staffing Performed: anesthesiologist  Anesthesiologist: Catalina Gravel, MD Preanesthetic Checklist Completed: patient identified, IV checked, risks and benefits discussed, surgical consent, monitors and equipment checked, pre-op evaluation and timeout performed Spinal Block Patient position: sitting Prep: DuraPrep and site prepped and draped Patient monitoring: continuous pulse ox and blood pressure Approach: midline Location: L3-4 Injection technique: single-shot Needle Needle type: Pencan  Needle gauge: 24 G Assessment Sensory level: T6 Additional Notes Functioning IV was confirmed and monitors were applied. Sterile prep and drape, including hand hygiene, mask and sterile gloves were used. The patient was positioned and the spine was prepped. The skin was anesthetized with lidocaine.  Free flow of clear CSF was obtained prior to injecting local anesthetic into the CSF.  The spinal needle aspirated freely following injection.  The needle was carefully withdrawn.  The patient tolerated the procedure well. Consent was obtained prior to procedure with all questions answered and concerns addressed. Risks including but not limited to bleeding, infection, nerve damage, paralysis, failed block, inadequate analgesia, allergic reaction, high spinal, itching and headache were discussed and the patient wished to proceed.   Hoy Morn, MD

## 2019-05-20 NOTE — Transfer of Care (Signed)
Immediate Anesthesia Transfer of Care Note  Patient: Angelica [name] [facility]  Procedure(s) Performed: Repeat CESAREAN SECTION (N/A Abdomen)  Patient Location: PACU  Anesthesia Type:Spinal  Level of Consciousness: awake and alert   Airway & Oxygen Therapy: Patient Spontanous Breathing  Post-op Assessment: Report given to RN and Post -op Vital signs reviewed and stable  Post vital signs: Reviewed  Last Vitals:  Vitals Value Taken Time  BP 112/71 05/20/19 1508  Temp    Pulse 68 05/20/19 1512  Resp 18 05/20/19 1512  SpO2 100 % 05/20/19 1512  Vitals shown include unvalidated device data.  Last Pain:  Vitals:   05/20/19 1221  TempSrc: Oral      Patients Stated Pain Goal: 7 ([id number])  Complications: No apparent anesthesia complications

## 2019-05-20 NOTE — Brief Op Note (Signed)
05/20/2019  3:45 PM  PATIENT:  Angelica [name]  34 y.o. female  PRE-OPERATIVE DIAGNOSIS:  History of Classical Cesarean Section, chronic hypertension with onset of pre-eclampsia  POST-OPERATIVE DIAGNOSIS:  History of Classical Cesarean Section, chronic hypertension with onset of pre-eclampsia  PROCEDURE:  Procedure(s) with comments: Repeat CESAREAN SECTION (N/A) - EDD: 06/13/19 Low transverse cesarean section with 2 layer closure Vacuum-assisted cesarean delivery  SURGEON:  Surgeon(s) and Role:    Aloha Gell, MD - Primary  PHYSICIAN ASSISTANT: Surgical tech  ASSISTANTS: none   ANESTHESIA:   spinal  EBL:  100 mL   BLOOD ADMINISTERED:none  DRAINS: Urinary Catheter (Foley)   LOCAL MEDICATIONS USED:  NONE  SPECIMEN:  Source of Specimen:  Placenta  DISPOSITION OF SPECIMEN:  PATHOLOGY  COUNTS:  YES  TOURNIQUET:  * No tourniquets in log *  DICTATION: .Note written in EPIC  PLAN OF CARE: Admit to inpatient   PATIENT DISPOSITION:  PACU - hemodynamically stable.   Delay start of Pharmacological VTE agent (>24hrs) due to surgical blood loss or risk of bleeding: yes

## 2019-05-20 NOTE — Anesthesia Preprocedure Evaluation (Addendum)
Anesthesia Evaluation  Patient identified by MRN, date of birth, ID band Patient awake    Reviewed: Allergy & Precautions, NPO status , Patient's Chart, lab work & pertinent test results, reviewed documented beta blocker date and time   Airway Mallampati: II  TM Distance: >3 FB Neck ROM: Full    Dental  (+) Teeth Intact, Dental Advisory Given, Chipped,    Pulmonary former smoker,    Pulmonary exam normal breath sounds clear to auscultation       Cardiovascular hypertension, Pt. on home beta blockers Normal cardiovascular exam Rhythm:Regular Rate:Normal     Neuro/Psych PSYCHIATRIC DISORDERS Anxiety Depression negative neurological ROS     GI/Hepatic negative GI ROS, Neg liver ROS,   Endo/Other  negative endocrine ROS  Renal/GU Renal InsufficiencyRenal disease     Musculoskeletal negative musculoskeletal ROS (+)   Abdominal   Peds  (+) ADHD Hematology Plt 137k on 05/19/2019   Anesthesia Other Findings Day of surgery medications reviewed with the patient.  Reproductive/Obstetrics (+) Pregnancy H/o C-section x1 for HELLP; now with pre-eclampsia                             Anesthesia Physical Anesthesia Plan  ASA: III  Anesthesia Plan: Spinal   Post-op Pain Management:    Induction:   PONV Risk Score and Plan: 2 and Ondansetron, Dexamethasone and Scopolamine patch - Pre-op  Airway Management Planned: Natural Airway  Additional Equipment:   Intra-op Plan:   Post-operative Plan:   Informed Consent: I have reviewed the patients History and Physical, chart, labs and discussed the procedure including the risks, benefits and alternatives for the proposed anesthesia with the patient or authorized representative who has indicated his/her understanding and acceptance.     Dental advisory given  Plan Discussed with: CRNA, Anesthesiologist and Surgeon  Anesthesia Plan Comments:  (Discussed risks and benefits of and differences between spinal and general. Discussed risks of spinal including headache, backache, failure, bleeding, infection, and nerve damage. Patient consents to spinal. Questions answered. Coagulation studies and platelet count acceptable.)        Anesthesia Quick Evaluation

## 2019-05-21 ENCOUNTER — Other Ambulatory Visit (HOSPITAL_COMMUNITY)
Admission: RE | Admit: 2019-05-21 | Discharge: 2019-05-21 | Disposition: A | Discharge status: Home / Self Care | Payer: 59 | Source: Ambulatory Visit | Attending: Obstetrics | Admitting: Obstetrics

## 2019-05-21 HISTORY — DX: Unspecified abnormal cytological findings in specimens from vagina: R87.629

## 2019-05-21 HISTORY — DX: Anogenital (venereal) warts: A63.0

## 2019-05-21 HISTORY — DX: Supervision of pregnancy with other poor reproductive or obstetric history, unspecified trimester: O09.299

## 2019-05-21 HISTORY — DX: Chronic kidney disease, unspecified: N18.9

## 2019-05-21 LAB — COMPREHENSIVE METABOLIC PANEL
ALT: 28 U/L (ref 0–44)
AST: 36 U/L (ref 15–41)
Albumin: 2.3 g/dL — ABNORMAL LOW (ref 3.5–5.0)
Alkaline Phosphatase: 96 U/L (ref 38–126)
Anion gap: 10 (ref 5–15)
BUN: 11 mg/dL (ref 6–20)
CO2: 20 mmol/L — ABNORMAL LOW (ref 22–32)
Calcium: 8.5 mg/dL — ABNORMAL LOW (ref 8.9–10.3)
Chloride: 106 mmol/L (ref 98–111)
Creatinine, Ser: 1.07 mg/dL — ABNORMAL HIGH (ref 0.44–1.00)
GFR calc Af Amer: >60 mL/min (ref >60)
GFR calc non Af Amer: >60 mL/min (ref >60)
Glucose, Bld: 85 mg/dL (ref 70–99)
Potassium: 3.7 mmol/L (ref 3.5–5.1)
Sodium: 136 mmol/L (ref 135–145)
Total Bilirubin: 0.9 mg/dL (ref 0.3–1.2)
Total Protein: 4.8 g/dL — ABNORMAL LOW (ref 6.5–8.1)

## 2019-05-21 MED ORDER — OXYCODONE HCL 5 MG PO TABS
5.0000 mg | ORAL_TABLET | ORAL | Status: DC | PRN
  Administered 2019-05-21 – 2019-05-23 (×7): 5 mg via ORAL
  Filled 2019-05-21 (×7): qty 1

## 2019-05-21 MED ORDER — OXYCODONE HCL 5 MG PO TABS: 10.0000 mg | ORAL_TABLET | ORAL | Status: DC | PRN

## 2019-05-21 MED ORDER — ACETAMINOPHEN 500 MG PO TABS
1000.0000 mg | ORAL_TABLET | Freq: Four times a day (QID) | ORAL | Status: DC
  Administered 2019-05-21 – 2019-05-23 (×6): 1000 mg via ORAL
  Filled 2019-05-21 (×6): qty 2

## 2019-05-21 NOTE — Lactation Note (Signed)
This note was copied from a baby's chart. Lactation Consultation Note Baby 9 hrs old at time of consult. 36 wks wt. 5.2lbs parents understand the importance of supplementing. Mom has some colostrum, giving that first then prefers to give Donor milk.  Baby has taken supplement well so far.  Mom has inverted nipples. RN had given mom shells. Noted slight everting w/inverted center. Not very compressible, baby unable to latch. Fitted W/#20 NS.   Baby has no interest in BF. Having some bubbles in his mouth. RN used suction bulb to clear mouth. Attempted to latch. Baby wouldn't open mouth. Hand expressed a few drops of colostrum to spoon feed baby for stimulation. Baby took colostrum well w/spoon.  W/gloved finger suck training performed. Baby has tight suck, tongue thrusting at times. Fitted mom w/#20 NS. Had difficulty latching d/t disinterest. Got baby suckling on gloved finger then moved onto NS. Baby suckled a few times but no active feeding.   Spoon fed 1 ml colostrum. Took well. Parents wants Donor milk. LC got a bottle, reviewed set up, giving, and milk storage. Reviewed syring feeding as well. FOB had been giving Donor milk earlier.  LPI information sheet given, reviewed supplementing, timed feeding, behavior, feeding  Habits, breast massage, STS, I&O, positioning, support, safety, pumping, supply and demand.  Mom shown how to use DEBP & how to disassemble, clean, & reassemble parts. Mom knows to pump q3h for 15-20 min. Mom encouraged to waken baby for feeds.   Baby is very jittery, glucose was normal.   Strongly encouraged STS and hand expression after pumping.   RN notified of status. Lactation brochure given.  Patient Name: Angelica [name] [facility] [id number] Date: 05/21/2019 Reason for consult: Initial assessment;1st time breastfeeding;Primapara;Late-preterm 34-36.6wks;Infant < 6lbs   Maternal Data Has patient been taught Hand Expression?: Yes Does the patient have  breastfeeding experience prior to this delivery?: No  Feeding Feeding Type: Breast Milk  LATCH Score Latch: Too sleepy or reluctant, no latch achieved, no sucking elicited.  Audible Swallowing: None  Type of Nipple: Inverted  Comfort (Breast/Nipple): Soft / non-tender  Hold (Positioning): Full assist, staff holds infant at breast  LATCH Score: 2  Interventions Interventions: Breast feeding basics reviewed;Support pillows;Assisted with latch;Position options;Skin to skin;Expressed milk;Breast massage;Hand express;Shells;Pre-pump if needed;Hand pump;Breast compression;DEBP;Adjust position  Lactation Tools Discussed/Used Tools: Shells;Nipple Shields Nipple shield size: 20 Shell Type: Inverted WIC Program: No Pump Review: Setup, frequency, and cleaning;Milk Storage Initiated by:: Allayne Stack RN IBCLC Date initiated:: 05/21/19   Consult Status Consult Status: Follow-up Date: 05/21/19 Follow-up type: In-patient    Theodoro Kalata 05/21/2019, 12:48 AM

## 2019-05-21 NOTE — Progress Notes (Signed)
POD# 1  S: Pt notes pain controlled w/ po meds, minimal lochia, nl void, out of bed w/o dizziness or chest pain, tol reg po, + flatus. Pt is  Breastfeeding. No HA, no vision change, no RUQ pain, no epigastric pain.   Vitals:   05/20/19 2014 05/21/19 0053 05/21/19 0446 05/21/19 0900  BP:  113/75 108/64 113/69  Pulse:  72 73 79  Resp: 18 18 18 18   Temp: 98.6 F (37 C) 98.9 F (37.2 C) 98.8 F (37.1 C) 99 F (37.2 C)  TempSrc: Oral Oral Oral Oral  SpO2: 97% 97% 98% 99%  Weight:      Height:        Gen: well appearing CV: RRR Pulm: CTAB Abd: soft, ND, approp tender, fundus below umbilicus, NT Inc: intact, staples, drainage on dressing.  LE: tr edema, NT  CBC    Component Value Date/Time   WBC 7.2 05/21/2019 0553   RBC 3.64 (L) 05/21/2019 0553   HGB 11.3 (L) 05/21/2019 0553   HCT 32.6 (L) 05/21/2019 0553   PLT 93 (L) 05/21/2019 0553   MCV 89.6 05/21/2019 0553   MCV 87.4 05/15/2011 1155   MCH 31.0 05/21/2019 0553   MCHC 34.7 05/21/2019 0553   RDW 13.2 05/21/2019 0553   LYMPHSABS 2.0 01/26/2017 1045   MONOABS 0.3 01/26/2017 1045   EOSABS 0.3 01/26/2017 1045   BASOSABS 0.1 01/26/2017 1045    A/P: POD#  1 s/p RCS for PEC, stable bps w/o meds, labs trending down. plts low and hold ibuprofen til recheck tomorrow - post-op. Doing well.  - baby well.   [name] 05/21/2019 12:00 PM

## 2019-05-21 NOTE — Progress Notes (Signed)
CSW received consult for hx of Anxiety and Depression, fetal demise history, and PTD @ 25 weeks.    CSW met with MOB and FOB at bedside to offer support and complete MOB assessment. Angelica [name] was present and being held by Angelica [name]. FOB stepped out of room during assessment to offer MOB privacy, however, CSW invited FOB back in room when it was time for PPD and SIDS education. MOB and FOB were pleasant and engaged during visit.   MOB confirmed history of depression and anxiety is related to grief and loss associated with fetal demise and PTD at 25 weeks hx. MOB denied any BH hx before that point. MOB stated medication (Zoloft, Wellbutrin, and Buspar), therapy at [facility], and support from loved ones has really helped to stabilize depression and anxiety sx. MOB also added, as a therapist, she is aware of sx and appropriate strategies, therefore, she does frequent check-ins with herself. MOB denied any SI, HI, or domestic violence. MOB identified her current mood as "happy and excited". MOB identified FOB, therapist, family, and friends as support system. MOB stated she feels she is doing really well and is excited about taking infant home.   CSW provided education regarding the baby blues period vs. perinatal mood disorders, discussed treatment and gave resources for mental health follow up if concerns arise.  CSW recommends self-evaluation during the postpartum time period using the New Mom Checklist from Postpartum Progress and encouraged MOB and FOB to contact a medical professional if symptoms are noted at any time. MOB and FOB asked appropriate questions and confirmed no additional questions or concerns.   CSW provided review of Sudden Infant Death Syndrome (SIDS) precautions.  MOB confirmed having all needed items for infant including new car seat and bassinet for safe sleeping area.    CSW identifies no further need for intervention and no barriers to discharge at this time.  Angelica [name] D.  Angelica [name], MSW, [place] [name] Clinical Social Worker [phone]

## 2019-05-21 NOTE — Progress Notes (Signed)
CSW acknowledged consult and attempted to meet with MOB. However, MOB was sleeping when CSW attempted visit.  CSW will meet with MOB at a later time.  Savier Trickett D. Lissa Morales, MSW, [facility] Clinical Social Worker [phone]

## 2019-05-21 NOTE — Progress Notes (Signed)
RN encouraged MOB to take pain medication and get OOB and walk either in her room or in the hallways. Patient was educated on the risk of decreased movement after C/S delivery. Patient verbalized understanding. RN also told patient how to shower and remove pressure dressing while in the shower. Patient will let RN know when she is ready to shower and have her dressing changed per orders. Timoteo Ace, RN

## 2019-05-21 NOTE — Anesthesia Postprocedure Evaluation (Signed)
Anesthesia Post Note  Patient: Angelica [name] [facility]  Procedure(s) Performed: Repeat CESAREAN SECTION (N/A Abdomen)     Patient location during evaluation: PACU Anesthesia Type: Spinal Level of consciousness: oriented and awake and alert Pain management: pain level controlled Vital Signs Assessment: post-procedure vital signs reviewed and stable Respiratory status: spontaneous breathing, respiratory function stable, patient connected to nasal cannula oxygen and nonlabored ventilation Cardiovascular status: blood pressure returned to baseline and stable Postop Assessment: no headache, no backache, no apparent nausea or vomiting, patient able to bend at knees and spinal receding Anesthetic complications: no    Last Vitals:  Vitals:   05/21/19 0053 05/21/19 0446  BP: 113/75 108/64  Pulse: 72 73  Resp: 18 18  Temp: 37.2 C 37.1 C  SpO2: 97% 98%    Last Pain:  Vitals:   05/21/19 0446  TempSrc: Oral  PainSc: 4    Pain Goal: Patients Stated Pain Goal: 2 (05/20/19 1630)                 Catalina Gravel

## 2019-05-22 LAB — CBC WITH DIFFERENTIAL/PLATELET
Abs Immature Granulocytes: 0.03 10*3/uL (ref 0.00–0.07)
Basophils Absolute: 0 10*3/uL (ref 0.0–0.1)
Basophils Relative: 0 %
Eosinophils Absolute: 0.1 10*3/uL (ref 0.0–0.5)
Eosinophils Relative: 2 %
HCT: 34.2 % — ABNORMAL LOW (ref 36.0–46.0)
Hemoglobin: 11.6 g/dL — ABNORMAL LOW (ref 12.0–15.0)
Immature Granulocytes: 0 %
Lymphocytes Relative: 17 %
Lymphs Abs: 1.3 10*3/uL (ref 0.7–4.0)
MCH: 31.4 pg (ref 26.0–34.0)
MCHC: 33.9 g/dL (ref 30.0–36.0)
MCV: 92.4 fL (ref 80.0–100.0)
Monocytes Absolute: 0.4 10*3/uL (ref 0.1–1.0)
Monocytes Relative: 5 %
Neutro Abs: 5.8 10*3/uL (ref 1.7–7.7)
Neutrophils Relative %: 76 %
Platelets: 113 10*3/uL — ABNORMAL LOW (ref 150–400)
RBC: 3.7 MIL/uL — ABNORMAL LOW (ref 3.87–5.11)
RDW: 13.4 % (ref 11.5–15.5)
WBC: 7.6 10*3/uL (ref 4.0–10.5)
nRBC: 0 % (ref 0.0–0.2)

## 2019-05-22 LAB — CBC
HCT: 32.6 % — ABNORMAL LOW (ref 36.0–46.0)
Hemoglobin: 11.3 g/dL — ABNORMAL LOW (ref 12.0–15.0)
MCH: 31 pg (ref 26.0–34.0)
MCHC: 34.7 g/dL (ref 30.0–36.0)
MCV: 89.6 fL (ref 80.0–100.0)
Platelets: 93 10*3/uL — ABNORMAL LOW (ref 150–400)
RBC: 3.64 MIL/uL — ABNORMAL LOW (ref 3.87–5.11)
RDW: 13.2 % (ref 11.5–15.5)
WBC: 7.2 10*3/uL (ref 4.0–10.5)
nRBC: 0 % (ref 0.0–0.2)

## 2019-05-22 NOTE — Lactation Note (Signed)
This note was copied from a baby's chart. Lactation Consultation Note  Patient Name: Osia Aye [facility] [id number] Date: 05/22/2019 Reason for consult: Follow-up assessment;Late-preterm 34-36.6wks;Primapara;1st time breastfeeding;Infant < 6lbs;Difficult latch  P1 mother whose infant is now 73 hours old.  This is a LPTI at 36+4 weeks with a CGA of 36+6 weeks, weighing < 6 lbs.  RN called for [facility] assistance.  Offered to assist with latching and mother accepted.  Spent considerable amount of time prior to latching reviewing breast feeding basics, LPTI, volume guidelines and supplementation.  Mother's breast tissue is minimal.  Her right breast has bruising from previous poor latches and is sore.  Her left breast is smaller and more comfortable.    Assessed infant's suck on my gloved finger: baby has a very small mouth and a tight suck.  He was a little bit uncoordinated and suck training performed.  Attempted to relax baby while allowing him to suck.  Asked mother to hand express colostrum drops.  After perfecting her technique she was able to express drops which I finger fed back to baby.  Mother was interested in using the cross cradle hold.  Assisted to latch in this position to the left breast.  Baby showed no interest in opening his mouth to latch.  After a couple of attempts I asked mother to place her NS on her breast.  Reviewed proper positioning and father used 2 mls of mother's colostrum and fed this to baby using the curved tip syringe.  He consumed this easily.  Asked father to put donor milk into NS tip and assisted baby to latch.  After a couple of attempts he latched and sucked the donor milk from the NS tip.  Demonstrated breast compressions and gentle stimulation to keep him awake and active at the breast.  Repeated this again and burped after.  With the third attempt baby would not latch.  I suggested trying the football hold and he latched and fed with donor milk in the NS tip.  Repeated  this one more time before he was finished trying.    Burped again and allowed father to feed more donor milk using the bottle and the green slow flow nipple.  Educated on paced bottle feeding and observed baby feeding well from the bottle.  Set up the DEBP for mother and observed her pumping.  Answered all questions/concerns while mother pumped. #24 flange size is appropriate at this time.  Taught mother how to observe the sizing in case she may need to graduate to the #27 flange size in the future.    Feeding/pumping plan established for the remainder of the day/evening.  Encouraged parents to supplement at least 20-30 mls every feeding and allow baby to consume more as desired.  Parents verbalized understanding.  Mother is anxious and much reassurance provided.  Relaxation techniques discussed for use during feeding and pumping.  Reassured mother that breast feeding takes patience and practice and she will eventually feel much more confident with this skill.  Praised both parents for their efforts and dedication.  Parents appreciative.  RN updated.  Mother has a DEBP for home use.     Maternal Data Formula Feeding for Exclusion: No Has patient been taught Hand Expression?: Yes Does the patient have breastfeeding experience prior to this delivery?: No  Feeding Feeding Type: Breast Fed Nipple Type: Slow - flow  LATCH Score Latch: Repeated attempts needed to sustain latch, nipple held in mouth throughout feeding, stimulation needed to elicit sucking  reflex.  Audible Swallowing: None  Type of Nipple: Flat  Comfort (Breast/Nipple): Filling, red/small blisters or bruises, mild/mod discomfort  Hold (Positioning): Assistance needed to correctly position infant at breast and maintain latch.  LATCH Score: 4  Interventions Interventions: Breast feeding basics reviewed;Breast massage;Skin to skin;Assisted with latch;Hand express;Pre-pump if needed;Breast compression;Adjust position;DEBP;Hand  pump;Coconut oil;Position options;Expressed milk;Support pillows  Lactation Tools Discussed/Used Tools: Pump;Coconut oil;Nipple Shields Nipple shield size: 20 Shell Type: Inverted Breast pump type: Double-Electric Breast Pump;Manual Pump Review: Setup, frequency, and cleaning;Milk Storage(Reviewed)   Consult Status Consult Status: Follow-up Date: 05/23/19 Follow-up type: In-patient    Payson Crumby R Makina Skow 05/22/2019, 4:04 PM

## 2019-05-22 NOTE — Progress Notes (Signed)
POD# 2  S: Pt notes pain controlled w/ po meds, minimal lochia, nl void, out of bed w/o dizziness or chest pain, tol reg po, + flatus. Pt is  Breastfeeding though supply low and supplementing. No BM yet. No nausea.   Vitals:   05/21/19 0900 05/21/19 1429 05/21/19 2100 05/22/19 0550  BP: 113/69 113/66 113/87 119/82  Pulse: 79 69 75 73  Resp: 18 18 18 16   Temp: 99 F (37.2 C) 98.6 F (37 C) 98.1 F (36.7 C) 98.5 F (36.9 C)  TempSrc: Oral Oral Oral Oral  SpO2: 99%   99%  Weight:      Height:        Gen: well appearing  Abd: soft distension, mild tympany, approp tender, fundus below umbilicus, NT Inc: C/D/I, staples will need removal tomorrow LE: tr edema, NT  CBC    Component Value Date/Time   WBC 7.6 05/22/2019 0557   RBC 3.70 (L) 05/22/2019 0557   HGB 11.6 (L) 05/22/2019 0557   HCT 34.2 (L) 05/22/2019 0557   PLT 113 (L) 05/22/2019 0557   MCV 92.4 05/22/2019 0557   MCV 87.4 05/15/2011 1155   MCH 31.4 05/22/2019 0557   MCHC 33.9 05/22/2019 0557   RDW 13.4 05/22/2019 0557   LYMPHSABS 1.3 05/22/2019 0557   MONOABS 0.4 05/22/2019 0557   EOSABS 0.1 05/22/2019 0557   BASOSABS 0.0 05/22/2019 0557    A/P: POD#  2 s/p RCS - post-op. Doing well.  - consent for circumcision - mild tympany on abdominal exam but no GI sx, awaiting full return of function - chronic htn, stable bps w/o meds. plts rebounding. No need to check labs again - expect home tomorrow.   [name] 05/22/2019 11:06 AM

## 2019-05-22 NOTE — Lactation Note (Signed)
This note was copied from a baby's chart. Lactation Consultation Note Baby 71 hrs old. Mom stated baby is latching much better. Mom is putting Donor milk into NS baby latches, suckles all of Donor milk out. BF about 10 minutes. Then is supplemented w/Donor milk. FOB stated he was giving baby 20-30 every 2 hrs. Baby is cueing to feed that often. Mom stated she isn't getting as much colostrum as yesterday. Explained normal, encouraged pumping every 3 hrs, hand express afterwards. Praised parents for hard work.  Patient Name: Angelica [name] [facility] [id number] Date: 05/22/2019 Reason for consult: Follow-up assessment;Late-preterm 34-36.6wks;Infant < 6lbs;Primapara;1st time breastfeeding   Maternal Data Has patient been taught Hand Expression?: Yes  Feeding Feeding Type: Donor Breast Milk Nipple Type: Slow - flow  LATCH Score Latch: Too sleepy or reluctant, no latch achieved, no sucking elicited.  Audible Swallowing: None  Type of Nipple: Inverted  Comfort (Breast/Nipple): Filling, red/small blisters or bruises, mild/mod discomfort(red/bruised)        Interventions Interventions: Skin to skin;Position options;Breast compression;Hand express  Lactation Tools Discussed/Used Tools: Nipple Shields Nipple shield size: 20   Consult Status Consult Status: Follow-up Date: 05/22/19 Follow-up type: In-patient    Theodoro Kalata 05/22/2019, 12:53 AM

## 2019-05-23 ENCOUNTER — Ambulatory Visit: Payer: Self-pay

## 2019-05-23 DIAGNOSIS — N181 Chronic kidney disease, stage 1: Secondary | ICD-10-CM

## 2019-05-23 DIAGNOSIS — N182 Chronic kidney disease, stage 2 (mild): Secondary | ICD-10-CM | POA: Diagnosis present

## 2019-05-23 MED ORDER — COCONUT OIL OIL: 1.0000 "application " | TOPICAL_OIL | 0 refills | Status: DC | PRN

## 2019-05-23 MED ORDER — IBUPROFEN 800 MG PO TABS: 800.0000 mg | ORAL_TABLET | Freq: Three times a day (TID) | ORAL | 0 refills | Status: DC

## 2019-05-23 MED ORDER — OXYCODONE HCL 5 MG PO TABS: 5.0000 mg | ORAL_TABLET | ORAL | 0 refills | Status: DC | PRN

## 2019-05-23 NOTE — Lactation Note (Signed)
This note was copied from a baby's chart. Lactation Consultation Note:Mother is a P1, infant  is now at 4 % wt loss.  Arrived in mothers room and mother very teary. I began to assist her with pumping. Mother using a #27 flange on the rt and #24 flange on the left . Mother had 10 ml of ebm for father to give with a bottle.   Mother plans an appt. With LC to follow up for a home visit. Mother was advised by her Nurse Midewife.  to take a break from breastfeed and just focus on pumping and give milk in.  Peds came in to exam infant . [place] assist with giving infant 9 ml of ebm . Infant was paced bottle fed.  Encouraged to offer additional donor milk.   Plan of Care : Breastfeed infant with feeding cues Supplement infant with ebm/donor milk , according to supplemental guidelines. Pump using a DEBP after each feeding for 15-20 mins.  Discussed treatment and prevention of engorgement.   Mother to continue to cue base feed infant and feed at least 8-12 times or more in 24 hours and advised to allow for cluster feeding infant as needed.   Mother to continue to due STS. Mother is aware of available LC services at [facility], BFSG'S, OP Dept, and phone # for questions or concerns about breastfeeding.  Mother receptive to all teaching and plan of care.    Patient Name: Angelica [name] [facility] [id number] Date: 05/23/2019 Reason for consult: Follow-up assessment   Maternal Data    Feeding Feeding Type: Breast Milk  LATCH Score                   Interventions Interventions: Breast feeding basics reviewed;Assisted with latch;Skin to skin;Hand express;Pre-pump if needed;Breast compression;Adjust position;Support pillows;Position options;Comfort gels;Hand pump;DEBP  Lactation Tools Discussed/Used     Consult Status Consult Status: Complete    Darla Lesches 05/23/2019, 2:29 PM

## 2019-05-23 NOTE — Lactation Note (Signed)
This note was copied from a baby's chart. Lactation Consultation Note Baby 6 hrs old. Mom's milk is starting to come in. Breast are hard and leaking. Mom pumped 2 hrs ago. Got mom pumping some to relieve breast then tried to latch baby. Mom needed #27 flange to Rt. Nipple and #24 flange to Lt. Breast. Mom stated it has been hurting when baby feeds. Mom using #20 NS. Mom needs #24 NS to Rt. Nipple and #20 to Lt. Nipple. Baby latched but clamps on NS. #24 NS is very filling for baby's small mouth. Baby latched only done a couple of sucks then topped. Baby removed from breast several times for stimulation then placed back at breast.  Placed baby to Lt. Breast using #20 NS, baby done the same thing. Wouldn't suck only clamp. When baby get on the first time and has a couple of sucks then clamps, mom wrenches in pain. Nipples very red, bruised, tender. Inverted nipples evert w/pumping. Mom pumped after feeding attempts. Mom's Rt. Breast larger than Lt. Breast.  FOB gives colostrum first then gives Donor milk. Baby appeared to be gulping w/green nipple. LC gave them purple nipples. Appeared to take well. LC assisted mom in pumping. Applied coconut oil to nipples and pumped. Applied warm compress to breast to help get milk flowing well. Discussed engorgement, management, milk storage, when to pump. Encouraged mom to go by how her breast feels and not watch clock as far as engorgement is concerned.  Pamphlet on Engorgement given. Baby has mid anterior tongue tie, visible. Paper on contacts tongue tie given. Paper on Nipple shields and pump cleaning given. Encouraged mom to relax, deep breathe. Mom very tense and hands shaky.  Comfort gels given for red raw looking nipples. Encouraged mom to make f/u appt. W/OP LC today. Mom needs to be seen again before d/c home.  Patient Name: Angelica [name] [facility] [id number] Date: 05/23/2019 Reason for consult: Follow-up assessment;Primapara;Infant <  6lbs;Late-preterm 34-36.6wks   Maternal Data    Feeding Feeding Type: Breast Milk Nipple Type: Extra Slow Flow  LATCH Score Latch: Too sleepy or reluctant, no latch achieved, no sucking elicited.  Audible Swallowing: None  Type of Nipple: Inverted  Comfort (Breast/Nipple): Engorged, cracked, bleeding, large blisters, severe discomfort  Hold (Positioning): Full assist, staff holds infant at breast  LATCH Score: 0  Interventions Interventions: Breast feeding basics reviewed;Support pillows;Assisted with latch;Position options;Skin to skin;Expressed milk;Breast massage;Coconut oil;Hand express;DEBP;Breast compression;Adjust position  Lactation Tools Discussed/Used Tools: Pump;Flanges;Nipple Shields Nipple shield size: 24;20 Flange Size: 24;27 Shell Type: Inverted Breast pump type: Double-Electric Breast Pump   Consult Status Consult Status: Follow-up Date: 05/23/19 Follow-up type: In-patient    Theodoro Kalata 05/23/2019, 5:17 AM

## 2019-05-23 NOTE — Discharge Summary (Signed)
OB Discharge Summary  Patient Name: Angelica [name] [facility] DOB: 05-24-1985 MRN: [id number]  Date of admission: 05/20/2019 Delivering provider: Aloha Gell   Date of discharge: 05/23/2019  Admitting diagnosis: Chronic hypertension affecting pregnancy [O10.919] History of classical cesarean section [Z98.891] Intrauterine pregnancy: [redacted]w[redacted]d     Secondary diagnosis:Principal Problem:   Postpartum care following cesarean delivery 3/19 Active Problems:   Anxiety and depression   Chronic hypertension affecting pregnancy   History of classical cesarean section   Kidney disease, chronic, stage II  Additional problems:none     Discharge diagnosis:  Patient Active Problem List   Diagnosis Date Noted  . Kidney disease, chronic, stage II 05/23/2019  . Chronic hypertension affecting pregnancy 05/20/2019  . History of classical cesarean section 05/20/2019  . Right ankle instability 09/07/2018  . Loss of transverse plantar arch 09/07/2018  . 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 Tracheoesophageal fistula ([place])   . HELLP syndrome 12/30/2016  . Cesarean delivery delivered - severe PEC, 25 wks, classic incision 10/27 12/28/2016  . Postpartum care following cesarean delivery 3/19 12/28/2016  . Severe preeclampsia 12/26/2016  . Chronic hypertension with exacerbation during pregnancy in second trimester 12/25/2016  . Hypertension 04/13/2016  . Echocardiogram shows left ventricular diastolic dysfunction [id number]  . Immune to hepatitis B 02/14/2015  . Anxiety and depression 02/14/2013  . ADD (attention deficit disorder) 02/14/2013  . Allergic rhinitis 02/14/2013                                                                Post partum procedures:none  Pain control: Spinal  Complications: None   Hospital course:  Sceduled C/S   34 y.o. yo [id number] at [redacted]w[redacted]d was admitted to the hospital 05/20/2019 for scheduled cesarean section with the following indication: history chronic kidney disease, stage  2, with exacerbation after severe PEC in her last pregnancy, which ended with a classical c/s at 25 wks for worsening maternal/ fetal factors and neonatal death on day of life #2. Onset of intermittent episodes of epigastric pain with mild current RUQ pain. Labs on 3/18 show acute increase in Cr (1.09 from .98 over the last few weeks), stable drop in plts (115 from baseline of 150s), new elevation to Uric Acid (7.7 from baseline 4.1), new protein on UA dip and slow rise in AST/ALT (21/21 from baseline 11/13).  Membrane Rupture Time/Date: 2:23 PM ,05/20/2019   Patient delivered a Viable infant.05/20/2019  Details of operation can be found in separate operative note.  Pateint had an uncomplicated postpartum course.  She is ambulating, tolerating a regular diet, passing flatus, and urinating well. Patient is discharged home in stable condition on  05/23/19, will have close F/U in office 1 week for BP check. Preeclampsia precautions reinforced, will ensure adequate sleep and have close f/u with lactation specialist in home for breastfeeding support.          Physical exam  Vitals:   05/22/19 0550 05/22/19 1600 05/22/19 2011 05/23/19 0519  BP: 119/82 122/89 119/80 129/80  Pulse: 73 80 78 70  Resp: 16 18 18 18   Temp: 98.5 F (36.9 C) 98.7 F (37.1 C) 98.1 F (36.7 C) 98.1 F (36.7 C)  TempSrc: Oral Oral Oral Oral  SpO2: 99% 98% 98% 99%  Weight:  Height:       General: alert and cooperative, crying this am d/t engorgement, difficulty latching baby to breast Lochia: appropriate Uterine Fundus: firm Incision: Healing well with no significant drainage DVT Evaluation: No cords or calf tenderness. No significant calf/ankle edema.  - Anxiety, depression. Exacerbated by neonatal demise last pregnancy. Follows with psychiatry and therapist. On Wellbutrin, Zoloft and Buspar. - gestational thrombocytopenia. Starting plts 150, dropped and stabilized around 115 over the last 3 wks of pregnancy. Rebound  inpatient to 113 from nadir 93 - O neg, newborn Rh negative   Labs: Lab Results  Component Value Date   WBC 7.6 05/22/2019   HGB 11.6 (L) 05/22/2019   HCT 34.2 (L) 05/22/2019   MCV 92.4 05/22/2019   PLT 113 (L) 05/22/2019   CMP Latest Ref Rng & Units 05/21/2019  Glucose 70 - 99 mg/dL 85  BUN 6 - 20 mg/dL 11  Creatinine 0.44 - 1.00 mg/dL 1.07(H)  Sodium 135 - 145 mmol/L 136  Potassium 3.5 - 5.1 mmol/L 3.7  Chloride 98 - 111 mmol/L 106  CO2 22 - 32 mmol/L 20(L)  Calcium 8.9 - 10.3 mg/dL 8.5(L)  Total Protein 6.5 - 8.1 g/dL 4.8(L)  Total Bilirubin 0.3 - 1.2 mg/dL 0.9  Alkaline Phos 38 - 126 U/L 96  AST 15 - 41 U/L 36  ALT 0 - 44 U/L 28   Edinburgh Postnatal Depression Scale Screening Tool 05/20/2019  I have been able to laugh and see the funny side of things. 0  I have looked forward with enjoyment to things. 0  I have blamed myself unnecessarily when things went wrong. 2  I have been anxious or worried for no good reason. 2  I have felt scared or panicky for no good reason. 2  Things have been getting on top of me. 1  I have been so unhappy that I have had difficulty sleeping. 0  I have felt sad or miserable. 1  I have been so unhappy that I have been crying. 0  The thought of harming myself has occurred to me. 0  Edinburgh Postnatal Depression Scale Total 8    Discharge instruction: per After Visit Summary and "Baby and Me Booklet".  After Visit Meds:  Allergies as of 05/23/2019      Reactions   Latex Anaphylaxis, Hives   Avocado    Never had a reaction, related to latex allergy   Banana Other (See Comments)   Sensitivity, not sureNever had a reaction, related to latex allergy   Kiwi Extract    Never had a reaction, related to latex allergy      Medication List    STOP taking these medications   aspirin 325 MG tablet   Folate 400 MCG tablet Generic drug: folic acid   labetalol 200 MG tablet Commonly known as: NORMODYNE     TAKE these medications    acetaminophen 500 MG tablet Commonly known as: TYLENOL Take 500 mg by mouth every 6 (six) hours as needed for mild pain or headache.   buPROPion 300 MG 24 hr tablet Commonly known as: WELLBUTRIN XL Take 300 mg by mouth daily.   busPIRone 15 MG tablet Commonly known as: BUSPAR Take 15 mg by mouth 2 (two) times daily.   calcium carbonate 500 MG chewable tablet Commonly known as: TUMS - dosed in mg elemental calcium Chew 1 tablet by mouth 2 (two) times daily as needed for indigestion or heartburn.   coconut oil Oil Apply 1 application  topically as needed.   EPINEPHrine 0.3 mg/0.3 mL Soaj injection Commonly known as: EPI-PEN INJECT 0.3MLS INTO THE MUSCLE ONCE What changed:   how much to take  how to take this  when to take this   ibuprofen 800 MG tablet Commonly known as: ADVIL Take 1 tablet (800 mg total) by mouth every 8 (eight) hours.   oxyCODONE 5 MG immediate release tablet Commonly known as: Oxy IR/ROXICODONE Take 1-2 tablets (5-10 mg total) by mouth every 4 (four) hours as needed for breakthrough pain.   prenatal multivitamin Tabs tablet Take 1 tablet by mouth daily at 12 noon.   sertraline 100 MG tablet Commonly known as: ZOLOFT Take 200 mg by mouth daily.   simethicone 125 MG chewable tablet Commonly known as: MYLICON Chew 0000000 mg by mouth daily as needed for flatulence.   Vitamin D 50 MCG (2000 UT) tablet Take 2,000 Units by mouth at bedtime.       Diet: routine diet  Activity: Advance as tolerated. Pelvic rest for 6 weeks.   Postpartum contraception: Not Discussed  Newborn Data: Live born female  Birth Weight: 5 lb 2.4 oz (2336 g) APGAR: 6, 9  Newborn Delivery   Birth date/time: 05/20/2019 14:24:00 Delivery type: C-Section, Vacuum Assisted Trial of labor: No C-section categorization: Repeat      named Gretta Cool Baby Feeding: Breast and expressed breast milk Disposition:home with mother   Delivery Report:  Review the Delivery Report for  details.    Follow up: Follow-up Information    Aloha Gell, MD. Go in 1 week(s).   Specialty: Obstetrics and Gynecology Why: BP check Contact information: [place] [place] [address] [phone]             Signed: Juliene Pina, CNM, MSN 05/23/2019, 10:07 AM

## 2019-05-24 LAB — SURGICAL PATHOLOGY

## 2019-06-30 ENCOUNTER — Other Ambulatory Visit: Payer: Self-pay | Admitting: Internal Medicine

## 2019-07-30 ENCOUNTER — Other Ambulatory Visit: Payer: Self-pay | Admitting: Internal Medicine

## 2019-08-29 ENCOUNTER — Other Ambulatory Visit: Payer: Self-pay | Admitting: Internal Medicine

## 2019-10-05 ENCOUNTER — Other Ambulatory Visit: Payer: Self-pay | Admitting: Internal Medicine

## 2019-11-04 ENCOUNTER — Other Ambulatory Visit: Payer: Self-pay | Admitting: Internal Medicine

## 2019-12-08 ENCOUNTER — Encounter: Payer: 59 | Admitting: Internal Medicine

## 2020-02-06 NOTE — Patient Instructions (Addendum)
Blood work was ordered.     No immunization administered today.   Medications changes include :   none    Please followup in 1 year    Health Maintenance, Female Adopting a healthy lifestyle and getting preventive care are important in promoting health and wellness. Ask your health care provider about:  The right schedule for you to have regular tests and exams.  Things you can do on your own to prevent diseases and keep yourself healthy. What should I know about diet, weight, and exercise? Eat a healthy diet   Eat a diet that includes plenty of vegetables, fruits, low-fat dairy products, and lean protein.  Do not eat a lot of foods that are high in solid fats, added sugars, or sodium. Maintain a healthy weight Body mass index (BMI) is used to identify weight problems. It estimates body fat based on height and weight. Your health care provider can help determine your BMI and help you achieve or maintain a healthy weight. Get regular exercise Get regular exercise. This is one of the most important things you can do for your health. Most adults should:  Exercise for at least 150 minutes each week. The exercise should increase your heart rate and make you sweat (moderate-intensity exercise).  Do strengthening exercises at least twice a week. This is in addition to the moderate-intensity exercise.  Spend less time sitting. Even light physical activity can be beneficial. Watch cholesterol and blood lipids Have your blood tested for lipids and cholesterol at 35 years of age, then have this test every 5 years. Have your cholesterol levels checked more often if:  Your lipid or cholesterol levels are high.  You are older than 35 years of age.  You are at high risk for heart disease. What should I know about cancer screening? Depending on your health history and family history, you may need to have cancer screening at various ages. This may include screening for:  Breast  cancer.  Cervical cancer.  Colorectal cancer.  Skin cancer.  Lung cancer. What should I know about heart disease, diabetes, and high blood pressure? Blood pressure and heart disease  High blood pressure causes heart disease and increases the risk of stroke. This is more likely to develop in people who have high blood pressure readings, are of African descent, or are overweight.  Have your blood pressure checked: ? Every 3-5 years if you are 35-35 years of age. ? Every year if you are 35 years old or older. Diabetes Have regular diabetes screenings. This checks your fasting blood sugar level. Have the screening done:  Once every three years after age 35 if you are at a normal weight and have a low risk for diabetes.  More often and at a younger age if you are overweight or have a high risk for diabetes. What should I know about preventing infection? Hepatitis B If you have a higher risk for hepatitis B, you should be screened for this virus. Talk with your health care provider to find out if you are at risk for hepatitis B infection. Hepatitis C Testing is recommended for:  Everyone born from [birth year] through 1965.  Anyone with known risk factors for hepatitis C. Sexually transmitted infections (STIs)  Get screened for STIs, including gonorrhea and chlamydia, if: ? You are sexually active and are younger than 35 years of age. ? You are older than 35 years of age and your health care provider tells you that you are at risk  for this type of infection. ? Your sexual activity has changed since you were last screened, and you are at increased risk for chlamydia or gonorrhea. Ask your health care provider if you are at risk.  Ask your health care provider about whether you are at high risk for HIV. Your health care provider may recommend a prescription medicine to help prevent HIV infection. If you choose to take medicine to prevent HIV, you should first get tested for HIV. You should  then be tested every 3 months for as long as you are taking the medicine. Pregnancy  If you are about to stop having your period (premenopausal) and you may become pregnant, seek counseling before you get pregnant.  Take 400 to 800 micrograms (mcg) of folic acid every day if you become pregnant.  Ask for birth control (contraception) if you want to prevent pregnancy. Osteoporosis and menopause Osteoporosis is a disease in which the bones lose minerals and strength with aging. This can result in bone fractures. If you are 69 years old or older, or if you are at risk for osteoporosis and fractures, ask your health care provider if you should:  Be screened for bone loss.  Take a calcium or vitamin D supplement to lower your risk of fractures.  Be given hormone replacement therapy (HRT) to treat symptoms of menopause. Follow these instructions at home: Lifestyle  Do not use any products that contain nicotine or tobacco, such as cigarettes, e-cigarettes, and chewing tobacco. If you need help quitting, ask your health care provider.  Do not use street drugs.  Do not share needles.  Ask your health care provider for help if you need support or information about quitting drugs. Alcohol use  Do not drink alcohol if: ? Your health care provider tells you not to drink. ? You are pregnant, may be pregnant, or are planning to become pregnant.  If you drink alcohol: ? Limit how much you use to 0-1 drink a day. ? Limit intake if you are breastfeeding.  Be aware of how much alcohol is in your drink. In the [place] one drink equals one 12 oz bottle of beer (355 mL), one 5 oz glass of wine (148 mL), or one 1 oz glass of hard liquor (44 mL). General instructions  Schedule regular health, dental, and eye exams.  Stay current with your vaccines.  Tell your health care provider if: ? You often feel depressed. ? You have ever been abused or do not feel safe at home. Summary  Adopting a  healthy lifestyle and getting preventive care are important in promoting health and wellness.  Follow your health care provider's instructions about healthy diet, exercising, and getting tested or screened for diseases.  Follow your health care provider's instructions on monitoring your cholesterol and blood pressure. This information is not intended to replace advice given to you by your health care provider. Make sure you discuss any questions you have with your health care provider. Document Revised: 02/10/2018 Document Reviewed: 02/10/2018 Elsevier Patient Education  2020 [employer].

## 2020-02-06 NOTE — Progress Notes (Signed)
Subjective:    Patient ID: Angelica [name], female    DOB: 10-Feb-1986, 34 y.o.   MRN: [id number]   This visit occurred during the SARS-CoV-2 public health emergency.  Safety protocols were in place, including screening questions prior to the visit, additional usage of staff PPE, and extensive cleaning of exam room while observing appropriate contact time as indicated for disinfecting solutions.    HPI She is here for a physical exam.   Overall she is doing well.  Her son is 66 months old and doing well.  She is working from home part-time.  Her pregnancy went well and her blood pressure has remained well controlled.  She has had increased gas - she thinks it may be lactose.  She is doing lactose free milk and has cut out cheese.  She has decreased eggs and that has helped.  Red spot on left side of nose.  Moles on neck and back.  ? Derm evaluation.    Medications and allergies reviewed with patient and updated if appropriate.  Patient Active Problem List   Diagnosis Date Noted  . Kidney disease, chronic, stage II 05/23/2019  . Chronic hypertension affecting pregnancy 05/20/2019  . History of classical cesarean section 05/20/2019  . Right ankle instability 09/07/2018  . Loss of transverse plantar arch 09/07/2018  . 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 Tracheoesophageal fistula ([place])   . HELLP syndrome 12/30/2016  . Cesarean delivery delivered - severe PEC, 25 wks, classic incision 10/27 12/28/2016  . Postpartum care following cesarean delivery 3/19 12/28/2016  . Severe preeclampsia 12/26/2016  . Chronic hypertension with exacerbation during pregnancy in second trimester 12/25/2016  . Hypertension 04/13/2016  . Echocardiogram shows left ventricular diastolic dysfunction 68/34/1962  . Immune to hepatitis B 02/14/2015  . Anxiety and depression 02/14/2013  . ADD (attention deficit disorder) 02/14/2013  . Allergic rhinitis 02/14/2013    Current Outpatient Medications on  File Prior to Visit  Medication Sig Dispense Refill  . acetaminophen (TYLENOL) 500 MG tablet Take 500 mg by mouth every 6 (six) hours as needed for mild pain or headache.    Marland Kitchen buPROPion (WELLBUTRIN XL) 150 MG 24 hr tablet Take 150 mg by mouth every morning.    Marland Kitchen buPROPion (WELLBUTRIN XL) 300 MG 24 hr tablet Take 300 mg by mouth daily.    . busPIRone (BUSPAR) 15 MG tablet Take 15 mg by mouth 2 (two) times daily.     . calcium carbonate (TUMS - DOSED IN MG ELEMENTAL CALCIUM) 500 MG chewable tablet Chew 1 tablet by mouth 2 (two) times daily as needed for indigestion or heartburn.    . Cholecalciferol (VITAMIN D) 50 MCG (2000 UT) tablet Take 2,000 Units by mouth at bedtime.    . coconut oil OIL Apply 1 application topically as needed.  0  . EPINEPHrine 0.3 mg/0.3 mL IJ SOAJ injection INJECT 0.3MLS INTO THE MUSCLE ONCE (Patient taking differently: Inject 0.3 mg into the muscle once. INJECT 0.3MLS INTO THE MUSCLE ONCE) 1 Device 5  . ibuprofen (ADVIL) 800 MG tablet Take 1 tablet (800 mg total) by mouth every 8 (eight) hours. 30 tablet 0  . labetalol (NORMODYNE) 200 MG tablet TAKE 2 TABLETS BY MOUTH EVERY 8 HOURS 180 tablet 0  . LORazepam (ATIVAN) 0.5 MG tablet Take 0.5 mg by mouth daily as needed.    . Prenatal Vit-Fe Fumarate-FA (PRENATAL MULTIVITAMIN) TABS tablet Take 1 tablet by mouth daily at 12 noon.    . sertraline (ZOLOFT)  100 MG tablet Take 200 mg by mouth daily.    . simethicone (MYLICON) 454 MG chewable tablet Chew 125 mg by mouth daily as needed for flatulence.     No current facility-administered medications on file prior to visit.    Past Medical History:  Diagnosis Date  . ADHD   . Allergy   . Anxiety   . Chronic hypertension   . Chronic kidney disease   . Depression   . HELLP syndrome 2018  . History of pre-eclampsia in prior pregnancy, currently pregnant   . HPV (human papilloma virus) anogenital infection   . Pregnancy induced hypertension   . Tracheoesophageal fistula ([place])    . Vaginal Pap smear, abnormal     Past Surgical History:  Procedure Laterality Date  . CESAREAN SECTION N/A 12/27/2016   Procedure: CESAREAN SECTION;  Surgeon: Aloha Gell, MD;  Location: [facility];  Service: Obstetrics;  Laterality: N/A;  . CESAREAN SECTION N/A 05/20/2019   Procedure: Repeat CESAREAN SECTION;  Surgeon: Aloha Gell, MD;  Location: [place] [facility];  Service: Obstetrics;  Laterality: N/A;  EDD: 06/13/19  . DILATION AND CURETTAGE OF UTERUS  01/2013  . ESOPHAGUS SURGERY    . TRACHEAL SURGERY    . TRACHEOESOPHAGEAL FISTULA REPAIR    . VASCULAR RING REPAIR      Social History   Socioeconomic History  . Marital status: Married    Spouse name: n/a  . Number of children: 0  . Years of education: Master's  . Highest education level: Not on file  Occupational History  . 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 Occupation: [employer]    Comment: private practice  Tobacco Use  . Smoking status: Former Research scientist (life sciences)  . Smokeless tobacco: Never Used  Vaping Use  . Vaping Use: Never used  Substance and Sexual Activity  . Alcohol use: Not Currently    Alcohol/week: 5.0 - 10.0 standard drinks    Types: 5 - 10 Standard drinks or equivalent per week  . Drug use: No  . Sexual activity: Yes    Partners: Male  Other Topics Concern  . Not on file  Social History Narrative   Graduated from [employer] 2008.   Lives with her fiance.   Engaged to be married in May 2017.      Social Determinants of Health   Financial Resource Strain:   . Difficulty of Paying Living Expenses: Not on file  Food Insecurity:   . Worried About Charity fundraiser in the Last Year: Not on file  . Ran Out of Food in the Last Year: Not on file  Transportation Needs:   . Lack of Transportation (Medical): Not on file  . Lack of Transportation (Non-Medical): Not on file  Physical Activity:   . Days of Exercise per  Week: Not on file  . Minutes of Exercise per Session: Not on file  Stress:   . Feeling of Stress : Not on file  Social Connections:   . Frequency of Communication with Friends and Family: Not on file  . Frequency of Social Gatherings with Friends and Family: Not on file  . Attends Religious Services: Not on file  . Active Member of Clubs or Organizations: Not on file  . Attends Archivist Meetings: Not on file  . Marital Status: Not on file    Family History  Problem Relation Age of Onset  .  Diabetes Maternal Grandfather   . Stroke Paternal Grandmother   . Hyperlipidemia Paternal Grandmother   . Dementia Paternal Grandmother   . Mental illness Mother   . Mental illness Sister   . Cancer Maternal Grandmother     Review of Systems  Constitutional: Negative for chills and fever.  Eyes: Negative for visual disturbance.  Respiratory: Negative for cough, shortness of breath and wheezing.   Cardiovascular: Negative for chest pain, palpitations and leg swelling.  Gastrointestinal: Positive for diarrhea (with certain foods). Negative for abdominal pain (except with gas), blood in stool, constipation and nausea.       No gerd  Genitourinary: Negative for dysuria and hematuria.  Musculoskeletal: Positive for back pain (related to lifting carry). Negative for arthralgias.  Skin: Negative for rash.       moles  Neurological: Negative for dizziness, light-headedness and headaches.  Psychiatric/Behavioral: Positive for dysphoric mood. The patient is nervous/anxious.        Objective:   Vitals:   02/07/20 0957  BP: 112/72  Pulse: 82  Temp: 98 F (36.7 C)  SpO2: 97%   Filed Weights   02/07/20 0957  Weight: 146 lb (66.2 kg)   Body mass index is 24.3 kg/m.  BP Readings from Last 3 Encounters:  02/07/20 112/72  05/23/19 129/80  05/17/19 120/76    Wt Readings from Last 3 Encounters:  02/07/20 146 lb (66.2 kg)  05/20/19 153 lb (69.4 kg)  05/13/19 156 lb (70.8 kg)      Physical Exam Constitutional: She appears well-developed and well-nourished. No distress.  HENT:  Head: Normocephalic and atraumatic.  Right Ear: External ear normal. Normal ear canal and TM Left Ear: External ear normal.  Normal ear canal and TM Mouth/Throat: Oropharynx is clear and moist.  Eyes: Conjunctivae and EOM are normal.  Neck: Neck supple. No tracheal deviation present. No thyromegaly present.  No carotid bruit  Cardiovascular: Normal rate, regular rhythm and normal heart sounds.   No murmur heard.  No edema. Pulmonary/Chest: Effort normal and breath sounds normal. No respiratory distress. She has no wheezes. She has no rales.  Breast: deferred   Abdominal: Soft. She exhibits no distension. There is no tenderness.  Lymphadenopathy: She has no cervical adenopathy.  Skin: Skin is warm and dry. She is not diaphoretic.  Red marking on left side of nose-looks like a small capillary.  Larger skin tag on left side of the neck and mid lower back-both benign-appearing.  Discussed that removal would likely be cosmetic Psychiatric: She has a normal mood and affect. Her behavior is normal.        Assessment & Plan:   Physical exam: Screening blood work    ordered Immunizations   Had flu, tdap up to date, discussed covid booster Gyn  Up to date  Exercise  irregular Weight  normal Substance abuse    none      See Problem List for Assessment and Plan of chronic medical problems.  Follow-up in 1 year

## 2020-02-07 ENCOUNTER — Encounter: Payer: Self-pay | Admitting: Internal Medicine

## 2020-02-07 ENCOUNTER — Ambulatory Visit (INDEPENDENT_AMBULATORY_CARE_PROVIDER_SITE_OTHER): Payer: 59 | Admitting: Internal Medicine

## 2020-02-07 ENCOUNTER — Other Ambulatory Visit: Payer: Self-pay

## 2020-02-07 VITALS — BP 112/72 | HR 82 | Temp 98.0°F | Ht 65.0 in | Wt 146.0 lb

## 2020-02-07 DIAGNOSIS — F32A Depression, unspecified: Secondary | ICD-10-CM

## 2020-02-07 DIAGNOSIS — F419 Anxiety disorder, unspecified: Secondary | ICD-10-CM | POA: Diagnosis not present

## 2020-02-07 DIAGNOSIS — I10 Essential (primary) hypertension: Secondary | ICD-10-CM | POA: Diagnosis not present

## 2020-02-07 DIAGNOSIS — F988 Other specified behavioral and emotional disorders with onset usually occurring in childhood and adolescence: Secondary | ICD-10-CM

## 2020-02-07 DIAGNOSIS — Z0000 Encounter for general adult medical examination without abnormal findings: Secondary | ICD-10-CM | POA: Diagnosis not present

## 2020-02-07 LAB — COMPREHENSIVE METABOLIC PANEL
ALT: 15 U/L (ref 0–35)
AST: 15 U/L (ref 0–37)
Albumin: 4.5 g/dL (ref 3.5–5.2)
Alkaline Phosphatase: 46 U/L (ref 39–117)
BUN: 20 mg/dL (ref 6–23)
CO2: 25 mEq/L (ref 19–32)
Calcium: 9.7 mg/dL (ref 8.4–10.5)
Chloride: 106 mEq/L (ref 96–112)
Creatinine, Ser: 1.14 mg/dL (ref 0.40–1.20)
GFR: 62.37 mL/min (ref >60.00)
Glucose, Bld: 98 mg/dL (ref 70–99)
Potassium: 4.5 mEq/L (ref 3.5–5.1)
Sodium: 139 mEq/L (ref 135–145)
Total Bilirubin: 0.7 mg/dL (ref 0.2–1.2)
Total Protein: 7 g/dL (ref 6.0–8.3)

## 2020-02-07 LAB — CBC WITH DIFFERENTIAL/PLATELET
Basophils Absolute: 0 10*3/uL (ref 0.0–0.1)
Basophils Relative: 0.9 % (ref 0.0–3.0)
Eosinophils Absolute: 0.1 10*3/uL (ref 0.0–0.7)
Eosinophils Relative: 3.1 % (ref 0.0–5.0)
HCT: 42.3 % (ref 36.0–46.0)
Hemoglobin: 14.7 g/dL (ref 12.0–15.0)
Lymphocytes Relative: 25.8 % (ref 12.0–46.0)
Lymphs Abs: 1.2 10*3/uL (ref 0.7–4.0)
MCHC: 34.7 g/dL (ref 30.0–36.0)
MCV: 87.8 fl (ref 78.0–100.0)
Monocytes Absolute: 0.4 10*3/uL (ref 0.1–1.0)
Monocytes Relative: 7.6 % (ref 3.0–12.0)
Neutro Abs: 2.9 10*3/uL (ref 1.4–7.7)
Neutrophils Relative %: 62.6 % (ref 43.0–77.0)
Platelets: 185 10*3/uL (ref 150.0–400.0)
RBC: 4.82 Mil/uL (ref 3.87–5.11)
RDW: 12.2 % (ref 11.5–15.5)
WBC: 4.7 10*3/uL (ref 4.0–10.5)

## 2020-02-07 LAB — LIPID PANEL
Cholesterol: 180 mg/dL (ref 0–200)
HDL: 56.3 mg/dL (ref >39.00)
LDL Cholesterol: 100 mg/dL — ABNORMAL HIGH (ref 0–99)
NonHDL: 124.06
Total CHOL/HDL Ratio: 3
Triglycerides: 122 mg/dL (ref 0.0–149.0)
VLDL: 24.4 mg/dL (ref 0.0–40.0)

## 2020-02-07 LAB — TSH: TSH: 1.36 u[IU]/mL (ref 0.35–4.50)

## 2020-02-07 MED ORDER — LABETALOL HCL 100 MG PO TABS: 100.0000 mg | ORAL_TABLET | Freq: Two times a day (BID) | ORAL | Status: AC

## 2020-02-07 NOTE — Assessment & Plan Note (Signed)
Chronic Following with psychiatry

## 2020-02-07 NOTE — Assessment & Plan Note (Signed)
Chronic BP well controlled Continue labetalol 100 mg twice dail cmp

## 2020-05-09 NOTE — Progress Notes (Signed)
Subjective:    Patient ID: Angelica [name], female    DOB: 1985/11/21, 35 y.o.   MRN: [id number]  HPI The patient is here for an acute visit.  She is thinking getting pregnant again this next year.   Side pain - she started having some pain in her RUQ.  It started 3-4 weeks ago.  She even had it when she was pregnant.  It sometimes is after she eats or drinks.  She notices it sometimes when she is sitting in her office at work.  It may be postural.  It sometimes feels under her rib cage or sometimes higher up.  It feels like a dull pain.  No associated symptoms  .   Medications and allergies reviewed with patient and updated if appropriate.  Patient Active Problem List   Diagnosis Date Noted  . Kidney disease, chronic, stage II 05/23/2019  . Chronic hypertension affecting pregnancy 05/20/2019  . History of classical cesarean section 05/20/2019  . Right ankle instability 09/07/2018  . Loss of transverse plantar arch 09/07/2018  . 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 Tracheoesophageal fistula ([place])   . HELLP syndrome 12/30/2016  . Cesarean delivery delivered - severe PEC, 25 wks, classic incision 10/27 12/28/2016  . Postpartum care following cesarean delivery 3/19 12/28/2016  . Severe preeclampsia 12/26/2016  . Chronic hypertension with exacerbation during pregnancy in second trimester 12/25/2016  . Hypertension 04/13/2016  . Echocardiogram shows left ventricular diastolic dysfunction 56/25/6389  . Immune to hepatitis B 02/14/2015  . Anxiety and depression 02/14/2013  . ADD (attention deficit disorder) 02/14/2013  . Allergic rhinitis 02/14/2013    Current Outpatient Medications on File Prior to Visit  Medication Sig Dispense Refill  . aspirin 325 MG tablet Take 325 mg by mouth daily.    Marland Kitchen buPROPion (WELLBUTRIN XL) 150 MG 24 hr tablet Take 150 mg by mouth every morning.    Marland Kitchen buPROPion (WELLBUTRIN XL) 300 MG 24 hr tablet Take 300 mg by mouth daily.    . busPIRone (BUSPAR)  15 MG tablet Take 15 mg by mouth 2 (two) times daily.     . calcium carbonate (TUMS - DOSED IN MG ELEMENTAL CALCIUM) 500 MG chewable tablet Chew 1 tablet by mouth 2 (two) times daily as needed for indigestion or heartburn.    Marland Kitchen EPINEPHrine 0.3 mg/0.3 mL IJ SOAJ injection INJECT 0.3MLS INTO THE MUSCLE ONCE (Patient taking differently: Inject 0.3 mg into the muscle once. INJECT 0.3MLS INTO THE MUSCLE ONCE) 1 Device 5  . labetalol (NORMODYNE) 100 MG tablet Take 1 tablet (100 mg total) by mouth 2 (two) times daily.    Marland Kitchen LORazepam (ATIVAN) 0.5 MG tablet Take 0.5 mg by mouth daily as needed.    . Prenatal Vit-Fe Fumarate-FA (PRENATAL VITAMINS PO) Take by mouth.    . sertraline (ZOLOFT) 100 MG tablet Take 200 mg by mouth daily.    . simethicone (MYLICON) 373 MG chewable tablet Chew 125 mg by mouth daily as needed for flatulence.     No current facility-administered medications on file prior to visit.    Past Medical History:  Diagnosis Date  . ADHD   . Allergy   . Anxiety   . Chronic hypertension   . Chronic kidney disease   . Depression   . HELLP syndrome 2018  . History of pre-eclampsia in prior pregnancy, currently pregnant   . HPV (human papilloma virus) anogenital infection   . Pregnancy induced hypertension   . Tracheoesophageal fistula ([place])   .  Vaginal Pap smear, abnormal     Past Surgical History:  Procedure Laterality Date  . CESAREAN SECTION N/A 12/27/2016   Procedure: CESAREAN SECTION;  Surgeon: Aloha Gell, MD;  Location: [facility];  Service: Obstetrics;  Laterality: N/A;  . CESAREAN SECTION N/A 05/20/2019   Procedure: Repeat CESAREAN SECTION;  Surgeon: Aloha Gell, MD;  Location: [facility] [facility];  Service: Obstetrics;  Laterality: N/A;  EDD: 06/13/19  . DILATION AND CURETTAGE OF UTERUS  01/2013  . ESOPHAGUS SURGERY    . TRACHEAL SURGERY    . TRACHEOESOPHAGEAL FISTULA REPAIR    . VASCULAR RING REPAIR      Social History   Socioeconomic History  . Marital  status: Married    Spouse name: n/a  . Number of children: 0  . Years of education: Master's  . Highest education level: Not on file  Occupational History  . 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 Occupation: [employer]    Comment: private practice  Tobacco Use  . Smoking status: Former Research scientist (life sciences)  . Smokeless tobacco: Never Used  Vaping Use  . Vaping Use: Never used  Substance and Sexual Activity  . Alcohol use: Not Currently    Alcohol/week: 5.0 - 10.0 standard drinks    Types: 5 - 10 Standard drinks or equivalent per week  . Drug use: No  . Sexual activity: Yes    Partners: Male  Other Topics Concern  . Not on file  Social History Narrative   Graduated from [employer] 2008.   Lives with her fiance.   Engaged to be married in May 2017.      Social Determinants of Health   Financial Resource Strain: Not on file  Food Insecurity: Not on file  Transportation Needs: Not on file  Physical Activity: Not on file  Stress: Not on file  Social Connections: Not on file    Family History  Problem Relation Age of Onset  . Diabetes Maternal Grandfather   . Stroke Paternal Grandmother   . Hyperlipidemia Paternal Grandmother   . Dementia Paternal Grandmother   . Mental illness Mother   . Mental illness Sister   . Cancer Maternal Grandmother     Review of Systems  Constitutional: Negative for appetite change and fever.  Respiratory: Negative for shortness of breath.   Cardiovascular: Negative for chest pain and palpitations.  Gastrointestinal: Positive for abdominal pain. Negative for blood in stool, constipation, diarrhea and nausea.       No gerd       Objective:   Vitals:   05/10/20 0836  BP: 102/70  Pulse: 80  Temp: 98 F (36.7 C)  SpO2: 96%   BP Readings from Last 3 Encounters:  05/10/20 102/70  02/07/20 112/72  05/23/19 129/80   Wt Readings from Last 3 Encounters:  05/10/20 140  lb (63.5 kg)  02/07/20 146 lb (66.2 kg)  05/20/19 153 lb (69.4 kg)   Body mass index is 23.3 kg/m.   Physical Exam Constitutional:      General: She is not in acute distress.    Appearance: Normal appearance. She is not ill-appearing.  HENT:     Head: Normocephalic and atraumatic.  Cardiovascular:     Rate and Rhythm: Normal rate and regular rhythm.  Pulmonary:     Effort: Pulmonary effort is normal. No respiratory distress.     Breath sounds: Normal breath sounds. No wheezing  or rales.  Abdominal:     General: There is no distension.     Palpations: Abdomen is soft. There is no mass.     Tenderness: There is no abdominal tenderness. There is no guarding or rebound.     Hernia: No hernia is present.  Skin:    General: Skin is warm and dry.  Neurological:     Mental Status: She is alert.            Assessment & Plan:    See Problem List for Assessment and Plan of chronic medical problems.    This visit occurred during the SARS-CoV-2 public health emergency.  Safety protocols were in place, including screening questions prior to the visit, additional usage of staff PPE, and extensive cleaning of exam room while observing appropriate contact time as indicated for disinfecting solutions.

## 2020-05-10 ENCOUNTER — Ambulatory Visit: Payer: 59 | Admitting: Internal Medicine

## 2020-05-10 ENCOUNTER — Other Ambulatory Visit: Payer: Self-pay

## 2020-05-10 ENCOUNTER — Encounter: Payer: Self-pay | Admitting: Internal Medicine

## 2020-05-10 VITALS — BP 102/70 | HR 80 | Temp 98.0°F | Ht 65.0 in | Wt 140.0 lb

## 2020-05-10 DIAGNOSIS — R1011 Right upper quadrant pain: Secondary | ICD-10-CM

## 2020-05-10 LAB — COMPREHENSIVE METABOLIC PANEL
ALT: 15 U/L (ref 0–35)
AST: 17 U/L (ref 0–37)
Albumin: 4.5 g/dL (ref 3.5–5.2)
Alkaline Phosphatase: 38 U/L — ABNORMAL LOW (ref 39–117)
BUN: 20 mg/dL (ref 6–23)
CO2: 29 mEq/L (ref 19–32)
Calcium: 9.9 mg/dL (ref 8.4–10.5)
Chloride: 103 mEq/L (ref 96–112)
Creatinine, Ser: 1.25 mg/dL — ABNORMAL HIGH (ref 0.40–1.20)
GFR: 55.74 mL/min — ABNORMAL LOW (ref >60.00)
Glucose, Bld: 100 mg/dL — ABNORMAL HIGH (ref 70–99)
Potassium: 4.3 mEq/L (ref 3.5–5.1)
Sodium: 138 mEq/L (ref 135–145)
Total Bilirubin: 0.8 mg/dL (ref 0.2–1.2)
Total Protein: 7.2 g/dL (ref 6.0–8.3)

## 2020-05-10 LAB — CBC WITH DIFFERENTIAL/PLATELET
Basophils Absolute: 0 10*3/uL (ref 0.0–0.1)
Basophils Relative: 0.9 % (ref 0.0–3.0)
Eosinophils Absolute: 0.2 10*3/uL (ref 0.0–0.7)
Eosinophils Relative: 3.4 % (ref 0.0–5.0)
HCT: 43.2 % (ref 36.0–46.0)
Hemoglobin: 14.9 g/dL (ref 12.0–15.0)
Lymphocytes Relative: 27.6 % (ref 12.0–46.0)
Lymphs Abs: 1.5 10*3/uL (ref 0.7–4.0)
MCHC: 34.6 g/dL (ref 30.0–36.0)
MCV: 87.5 fl (ref 78.0–100.0)
Monocytes Absolute: 0.4 10*3/uL (ref 0.1–1.0)
Monocytes Relative: 6.6 % (ref 3.0–12.0)
Neutro Abs: 3.4 10*3/uL (ref 1.4–7.7)
Neutrophils Relative %: 61.5 % (ref 43.0–77.0)
Platelets: 183 10*3/uL (ref 150.0–400.0)
RBC: 4.93 Mil/uL (ref 3.87–5.11)
RDW: 12.4 % (ref 11.5–15.5)
WBC: 5.5 10*3/uL (ref 4.0–10.5)

## 2020-05-10 NOTE — Patient Instructions (Addendum)
  Blood work was ordered.     An abdominal ultrasound was ordered.    Medications changes include :  none

## 2020-05-10 NOTE — Assessment & Plan Note (Addendum)
Acute Gong on 3-4 weeks - intermittent, but she did have it over one year ago while pregnant No GI symptoms ? GB dz, constipation, gas, msk - diaphragm, ? Less likely GERD, gastritis - taking 325 mg of ASA Cmp, cbc [place] of RUQ  She will monitor her symptoms

## 2020-05-29 ENCOUNTER — Ambulatory Visit
Admission: RE | Admit: 2020-05-29 | Discharge: 2020-05-29 | Disposition: A | Discharge status: Home / Self Care | Payer: 59 | Source: Ambulatory Visit | Attending: Internal Medicine | Admitting: Internal Medicine

## 2020-05-29 DIAGNOSIS — R1011 Right upper quadrant pain: Secondary | ICD-10-CM

## 2020-06-18 ENCOUNTER — Encounter: Payer: Self-pay | Admitting: Internal Medicine

## 2020-06-18 DIAGNOSIS — N182 Chronic kidney disease, stage 2 (mild): Secondary | ICD-10-CM

## 2020-06-29 ENCOUNTER — Other Ambulatory Visit (INDEPENDENT_AMBULATORY_CARE_PROVIDER_SITE_OTHER): Payer: 59

## 2020-06-29 DIAGNOSIS — N182 Chronic kidney disease, stage 2 (mild): Secondary | ICD-10-CM | POA: Diagnosis not present

## 2020-06-29 LAB — COMPREHENSIVE METABOLIC PANEL
ALT: 14 U/L (ref 0–35)
AST: 14 U/L (ref 0–37)
Albumin: 4.3 g/dL (ref 3.5–5.2)
Alkaline Phosphatase: 39 U/L (ref 39–117)
BUN: 18 mg/dL (ref 6–23)
CO2: 28 mEq/L (ref 19–32)
Calcium: 9.5 mg/dL (ref 8.4–10.5)
Chloride: 105 mEq/L (ref 96–112)
Creatinine, Ser: 1.15 mg/dL (ref 0.40–1.20)
GFR: 61.55 mL/min (ref >60.00)
Glucose, Bld: 99 mg/dL (ref 70–99)
Potassium: 4 mEq/L (ref 3.5–5.1)
Sodium: 140 mEq/L (ref 135–145)
Total Bilirubin: 0.8 mg/dL (ref 0.2–1.2)
Total Protein: 6.8 g/dL (ref 6.0–8.3)

## 2020-08-06 ENCOUNTER — Telehealth: Payer: 59 | Admitting: Physician Assistant

## 2020-08-06 ENCOUNTER — Encounter: Payer: Self-pay | Admitting: Physician Assistant

## 2020-08-06 DIAGNOSIS — Z20822 Contact with and (suspected) exposure to covid-19: Secondary | ICD-10-CM | POA: Diagnosis not present

## 2020-08-06 DIAGNOSIS — J0140 Acute pansinusitis, unspecified: Secondary | ICD-10-CM | POA: Diagnosis not present

## 2020-08-06 MED ORDER — AMOXICILLIN-POT CLAVULANATE 875-125 MG PO TABS: 1.0000 | ORAL_TABLET | Freq: Two times a day (BID) | ORAL | 0 refills | Status: DC

## 2020-08-06 MED ORDER — BENZONATATE 100 MG PO CAPS: 100.0000 mg | ORAL_CAPSULE | Freq: Three times a day (TID) | ORAL | 0 refills | Status: AC | PRN

## 2020-08-06 MED ORDER — FLUTICASONE PROPIONATE 50 MCG/ACT NA SUSP: 2.0000 | Freq: Every day | NASAL | 6 refills | Status: AC

## 2020-08-06 MED ORDER — LIDOCAINE VISCOUS HCL 2 % MT SOLN: 15.0000 mL | Freq: Four times a day (QID) | OROMUCOSAL | 0 refills | Status: AC | PRN

## 2020-08-06 NOTE — Addendum Note (Signed)
Addended by: Mar Daring on: 08/06/2020 01:12 PM   Modules accepted: Orders

## 2020-08-06 NOTE — Patient Instructions (Signed)
Can take to lessen severity: Vit C 500mg  twice daily Quercertin 250-500mg  twice daily Zinc 75-100mg  daily Melatonin 3-6 mg at bedtime Vit D3 1000-2000 IU daily Aspirin 81 mg daily with food Optional: Famotidine 20mg  daily Also can add tylenol/ibuprofen as needed for fevers and body aches May add Mucinex or Mucinex DM as needed for cough/congestion  10 Things You Can Do to Manage Your COVID-19 Symptoms at Home If you have possible or confirmed COVID-19: 1. Stay home except to get medical care. 2. Monitor your symptoms carefully. If your symptoms get worse, call your healthcare provider immediately. 3. Get rest and stay hydrated. 4. If you have a medical appointment, call the healthcare provider ahead of time and tell them that you have or may have COVID-19. 5. For medical emergencies, call 911 and notify the dispatch personnel that you have or may have COVID-19. 6. Cover your cough and sneezes with a tissue or use the inside of your elbow. 7. Wash your hands often with soap and water for at least 20 seconds or clean your hands with an alcohol-based hand sanitizer that contains at least 60% alcohol. 8. As much as possible, stay in a specific room and away from other people in your home. Also, you should use a separate bathroom, if available. If you need to be around other people in or outside of the home, wear a mask. 9. Avoid sharing personal items with other people in your household, like dishes, towels, and bedding. 10. Clean all surfaces that are touched often, like counters, tabletops, and doorknobs. Use household cleaning sprays or wipes according to the label instructions. June 09/16/2019 This information is not intended to replace advice given to you by your health care provider. Make sure you discuss any questions you have with your health care provider. Document Revised: 01/02/2020 Document Reviewed: 01/02/2020 Elsevier Patient Education  2021 [place]: What to Do if You Are Sick If you have a fever, cough or other symptoms, you might have COVID-19. Most people have mild illness and are able to recover at home. If you are sick:  Keep track of your symptoms.  If you have an emergency warning sign (including trouble breathing), call 911. Steps to help prevent the spread of COVID-19 if you are sick If you are sick with COVID-19 or think you might have COVID-19, follow the steps below to care for yourself and to help protect other people in your home and community. Stay home except to get medical care  Stay home. Most people with COVID-19 have mild illness and can recover at home without medical care. Do not leave your home, except to get medical care. Do not visit public areas.  Take care of yourself. Get rest and stay hydrated. Take over-the-counter medicines, such as acetaminophen, to help you feel better.  Stay in touch with your doctor. Call before you get medical care. Be sure to get care if you have trouble breathing, or have any other emergency warning signs, or if you think it is an emergency.  Avoid public transportation, ride-sharing, or taxis. Separate yourself from other people As much as possible, stay in a specific room and away from other people and pets in your home. If possible, you should use a separate bathroom. If you need to be around other people or animals in or outside of the home, wear a mask. Tell your close contactsthat they may have been exposed to COVID-19. An infected person can spread COVID-19 starting 48  hours (or 2 days) before the person has any symptoms or tests positive. By letting your close contacts know they may have been exposed to COVID-19, you are helping to protect everyone.  Additional guidance is available for those living in close quarters and shared housing.  See COVID-19 and Animals if you have questions about pets.  If you are diagnosed with COVID-19, someone from the health  department may call you. Answer the call to slow the spread. Monitor your symptoms  Symptoms of COVID-19 include fever, cough, or other symptoms.  Follow care instructions from your healthcare provider and local health department. Your local health authorities may give instructions on checking your symptoms and reporting information. When to seek emergency medical attention Look for emergency warning signs* for COVID-19. If someone is showing any of these signs, seek emergency medical care immediately:  Trouble breathing  Persistent pain or pressure in the chest  New confusion  Inability to wake or stay awake  Pale, gray, or blue-colored skin, lips, or nail beds, depending on skin tone *This list is not all possible symptoms. Please call your medical provider for any other symptoms that are severe or concerning to you. Call 911 or call ahead to your local emergency facility: Notify the operator that you are seeking care for someone who has or may have COVID-19. Call ahead before visiting your doctor  Call ahead. Many medical visits for routine care are being postponed or done by phone or telemedicine.  If you have a medical appointment that cannot be postponed, call your doctor's office, and tell them you have or may have COVID-19. This will help the office protect themselves and other patients. Get  tested  If you have symptoms of COVID-19, get tested. While waiting for test results, you stay away from others, including staying apart from those living in your household.  You can visit your state, tribal, local, and territorialhealth department's website to look for the latest local information on testing sites. If you are sick, wear a mask over your nose and mouth  You should wear a mask over your nose and mouth if you must be around other people or animals, including pets (even at home).  You don't need to wear the mask if you are alone. If you can't put on a mask (because of  trouble breathing, for example), cover your coughs and sneezes in some other way. Try to stay at least 6 feet away from other people. This will help protect the people around you.  Masks should not be placed on young children under age 21 years, anyone who has trouble breathing, or anyone who is not able to remove the mask without help. Note: During the COVID-19 pandemic, medical grade facemasks are reserved for healthcare workers and some first responders. Cover your coughs and sneezes  Cover your mouth and nose with a tissue when you cough or sneeze.  Throw away used tissues in a lined trash can.  Immediately wash your hands with soap and water for at least 20 seconds. If soap and water are not available, clean your hands with an alcohol-based hand sanitizer that contains at least 60% alcohol. Clean your hands often  Wash your hands often with soap and water for at least 20 seconds. This is especially important after blowing your nose, coughing, or sneezing; going to the bathroom; and before eating or preparing food.  Use hand sanitizer if soap and water are not available. Use an alcohol-based hand sanitizer with at least  60% alcohol, covering all surfaces of your hands and rubbing them together until they feel dry.  Soap and water are the best option, especially if hands are visibly dirty.  Avoid touching your eyes, nose, and mouth with unwashed hands.  Handwashing Tips Avoid sharing personal household items  Do not share dishes, drinking glasses, cups, eating utensils, towels, or bedding with other people in your home.  Wash these items thoroughly after using them with soap and water or put in the dishwasher. Clean all "high-touch" surfaces everyday  Clean and disinfect high-touch surfaces in your "sick room" and bathroom; wear disposable gloves. Let someone else clean and disinfect surfaces in common areas, but you should clean your bedroom and bathroom, if possible.  If a  caregiver or other person needs to clean and disinfect a sick person's bedroom or bathroom, they should do so on an as-needed basis. The caregiver/other person should wear a mask and disposable gloves prior to cleaning. They should wait as long as possible after the person who is sick has used the bathroom before coming in to clean and use the bathroom. ? High-touch surfaces include phones, remote controls, counters, tabletops, doorknobs, bathroom fixtures, toilets, keyboards, tablets, and bedside tables.  Clean and disinfect areas that may have blood, stool, or body fluids on them.  Use household cleaners and disinfectants. Clean the area or item with soap and water or another detergent if it is dirty. Then, use a household disinfectant. ? Be sure to follow the instructions on the label to ensure safe and effective use of the product. Many products recommend keeping the surface wet for several minutes to ensure germs are killed. Many also recommend precautions such as wearing gloves and making sure you have good ventilation during use of the product. ? Use a product from [employer] List N: Disinfectants for Coronavirus (IYMEB-58). ? Complete Disinfection Guidance When you can be around others after being sick with COVID-19 Deciding when you can be around others is different for different situations. Find out when you can safely end home isolation. For any additional questions about your care, contact your healthcare provider or state or local health department. 05/18/2019 Content source: [facility] ([facility]), Division of Viral Diseases This information is not intended to replace advice given to you by your health care provider. Make sure you discuss any questions you have with your health care provider. Document Revised: 01/02/2020 Document Reviewed: 01/02/2020 Elsevier Patient Education  2021 [employer].

## 2020-08-06 NOTE — Progress Notes (Addendum)
Ms. ronin, crager are scheduled for a virtual visit with your provider today.    Just as we do with appointments in the office, we must obtain your consent to participate.  Your consent will be active for this visit and any virtual visit you may have with one of our providers in the next 365 days.    If you have a MyChart account, I can also send a copy of this consent to you electronically.  All virtual visits are billed to your insurance company just like a traditional visit in the office.  As this is a virtual visit, video technology does not allow for your provider to perform a traditional examination.  This may limit your provider's ability to fully assess your condition.  If your provider identifies any concerns that need to be evaluated in person or the need to arrange testing such as labs, EKG, etc, we will make arrangements to do so.    Although advances in technology are sophisticated, we cannot ensure that it will always work on either your end or our end.  If the connection with a video visit is poor, we may have to switch to a telephone visit.  With either a video or telephone visit, we are not always able to ensure that we have a secure connection.   I need to obtain your verbal consent now.   Are you willing to proceed with your visit today?   Ishani Goldwasser [facility] has provided verbal consent on 08/06/2020 for a virtual visit (video or telephone).   Mar Daring, PA-C 08/06/2020  11:34 AM    MyChart Video Visit    Virtual Visit via Video Note   This visit type was conducted due to national recommendations for restrictions regarding the COVID-19 Pandemic (e.g. social distancing) in an effort to limit this patient's exposure and mitigate transmission in our community. This patient is at least at moderate risk for complications without adequate follow up. This format is felt to be most appropriate for this patient at this time. Physical exam was limited by quality of the video and  audio technology used for the visit.   Patient location: Home Provider location: Home office in [place]  I discussed the limitations of evaluation and management by telemedicine and the availability of in person appointments. The patient expressed understanding and agreed to proceed.  Patient: Addaline Peplinski [employer]   DOB: December 31, 1985   35 y.o. Female  MRN: [id number] Visit Date: 08/06/2020  Today's healthcare provider: Mar Daring, PA-C   No chief complaint on file.  Subjective    Sinusitis This is a new problem. The current episode started in the past 7 days. The problem has been gradually worsening since onset. There has been no fever. The fever has been present for less than 1 day. The pain is mild. Associated symptoms include chills (one day early on, 1st day of illness), congestion (loss of taste or smell), coughing, ear pain (not pain, but fullness is intermittent), headaches, a hoarse voice, sinus pressure, a sore throat (x 1 week) and swollen glands (tonsils appear swollen). Pertinent negatives include no shortness of breath. Past treatments include acetaminophen (Mucinex, Nyquil). The treatment provided no relief.     Patient Active Problem List   Diagnosis Date Noted  . RUQ abdominal pain 05/10/2020  . Kidney disease, chronic, stage II 05/23/2019  . Chronic hypertension affecting pregnancy 05/20/2019  . History of classical cesarean section 05/20/2019  . Right ankle instability 09/07/2018  . Loss  of transverse plantar arch 09/07/2018  . 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 Tracheoesophageal fistula ([place])   . HELLP syndrome 12/30/2016  . Cesarean delivery delivered - severe PEC, 25 wks, classic incision 10/27 12/28/2016  . Postpartum care following cesarean delivery 3/19 12/28/2016  . Severe preeclampsia 12/26/2016  . Chronic hypertension with exacerbation during pregnancy in second trimester 12/25/2016  . Hypertension 04/13/2016  . Echocardiogram shows left  ventricular diastolic dysfunction 61/95/0932  . Immune to hepatitis B 02/14/2015  . Anxiety and depression 02/14/2013  . ADD (attention deficit disorder) 02/14/2013  . Allergic rhinitis 02/14/2013   Past Medical History:  Diagnosis Date  . ADHD   . Allergy   . Anxiety   . Chronic hypertension   . Chronic kidney disease   . Depression   . HELLP syndrome 2018  . History of pre-eclampsia in prior pregnancy, currently pregnant   . HPV (human papilloma virus) anogenital infection   . Pregnancy induced hypertension   . Tracheoesophageal fistula ([place])   . Vaginal Pap smear, abnormal       Medications: Outpatient Medications Prior to Visit  Medication Sig  . aspirin 325 MG tablet Take 325 mg by mouth daily.  Marland Kitchen buPROPion (WELLBUTRIN XL) 150 MG 24 hr tablet Take 150 mg by mouth every morning.  Marland Kitchen buPROPion (WELLBUTRIN XL) 300 MG 24 hr tablet Take 300 mg by mouth daily.  . busPIRone (BUSPAR) 15 MG tablet Take 15 mg by mouth 2 (two) times daily.   . calcium carbonate (TUMS - DOSED IN MG ELEMENTAL CALCIUM) 500 MG chewable tablet Chew 1 tablet by mouth 2 (two) times daily as needed for indigestion or heartburn.  Marland Kitchen EPINEPHrine 0.3 mg/0.3 mL IJ SOAJ injection INJECT 0.3MLS INTO THE MUSCLE ONCE (Patient taking differently: Inject 0.3 mg into the muscle once. INJECT 0.3MLS INTO THE MUSCLE ONCE)  . labetalol (NORMODYNE) 100 MG tablet Take 1 tablet (100 mg total) by mouth 2 (two) times daily.  Marland Kitchen LORazepam (ATIVAN) 0.5 MG tablet Take 0.5 mg by mouth daily as needed.  . Prenatal Vit-Fe Fumarate-FA (PRENATAL VITAMINS PO) Take by mouth.  . sertraline (ZOLOFT) 100 MG tablet Take 200 mg by mouth daily.  . simethicone (MYLICON) 671 MG chewable tablet Chew 125 mg by mouth daily as needed for flatulence.   No facility-administered medications prior to visit.    Review of Systems  Constitutional: Positive for chills (one day early on, 1st day of illness).  HENT: Positive for congestion (loss of taste or  smell), ear pain (not pain, but fullness is intermittent), hoarse voice, postnasal drip, rhinorrhea, sinus pressure, sinus pain and sore throat (x 1 week).   Respiratory: Positive for cough. Negative for chest tightness, shortness of breath and wheezing.   Cardiovascular: Negative.   Gastrointestinal: Positive for nausea and vomiting. Negative for abdominal pain.       GI symptoms were last Wednesday and lasted for about 12-24 hours  Neurological: Positive for dizziness (early on) and headaches.    Last CBC Lab Results  Component Value Date   WBC 5.5 05/10/2020   HGB 14.9 05/10/2020   HCT 43.2 05/10/2020   MCV 87.5 05/10/2020   MCH 31.4 05/22/2019   RDW 12.4 05/10/2020   PLT 183.0 24/58/0998   Last metabolic panel Lab Results  Component Value Date   GLUCOSE 99 06/29/2020   NA 140 06/29/2020   K 4.0 06/29/2020   CL 105 06/29/2020   CO2 28 06/29/2020   BUN 18 06/29/2020  CREATININE 1.15 06/29/2020   GFRNONAA >60 05/21/2019   GFRAA >60 05/21/2019   CALCIUM 9.5 06/29/2020   PROT 6.8 06/29/2020   ALBUMIN 4.3 06/29/2020   BILITOT 0.8 06/29/2020   ALKPHOS 39 06/29/2020   AST 14 06/29/2020   ALT 14 06/29/2020   ANIONGAP 10 05/21/2019      Objective    There were no vitals taken for this visit. BP Readings from Last 3 Encounters:  05/10/20 102/70  02/07/20 112/72  05/23/19 129/80   Wt Readings from Last 3 Encounters:  05/10/20 140 lb (63.5 kg)  02/07/20 146 lb (66.2 kg)  05/20/19 153 lb (69.4 kg)      Physical Exam Vitals reviewed.  Constitutional:      General: She is not in acute distress.    Appearance: Normal appearance. She is well-developed. She is ill-appearing.  HENT:     Head: Normocephalic and atraumatic.  Pulmonary:     Effort: Pulmonary effort is normal. No respiratory distress (dry cough heard x 1; no issues with speaking in full, coherent sentences).  Musculoskeletal:     Cervical back: Normal range of motion and neck supple.  Neurological:      Mental Status: She is alert.  Psychiatric:        Mood and Affect: Mood normal.        Behavior: Behavior normal.        Thought Content: Thought content normal.        Judgment: Judgment normal.        Assessment & Plan     1. Suspected COVID-19 virus infection - Advised patient to take at-home Covid 19 test to r/o Covid-19; symptoms are fairly consistent with infection - Continue OTC symptomatic management of choice - Will send OTC vitamins and supplement information through AV[facility] - Flonase, tessalon perles and viscous lidocaine provided to patient for symptomatic management - Patient enrolled in MyChart symptom monitoring - Push fluids - Rest as needed - Discussed return precautions and when to seek in-person evaluation, sent via [facility]S as well - MyChart COVID-19 home monitoring program; Future - fluticasone (FLONASE) 50 MCG/ACT nasal spray; Place 2 sprays into both nostrils daily.  Dispense: 16 g; Refill: 6 - benzonatate (TESSALON) 100 MG capsule; Take 1 capsule (100 mg total) by mouth 3 (three) times daily as needed.  Dispense: 30 capsule; Refill: 0 - lidocaine (XYLOCAINE) 2 % solution; Use as directed 15 mLs in the mouth or throat every 6 (six) hours as needed for mouth pain.  Dispense: 200 mL; Refill: 0 - Temperature monitoring; Future  *Addend: Patient notified provider Covid 19 testing is negative. Will treat with Augmentin for sinusitis.  No follow-ups on file.     I discussed the assessment and treatment plan with the patient. The patient was provided an opportunity to ask questions and all were answered. The patient agreed with the plan and demonstrated an understanding of the instructions.   The patient was advised to call back or seek an in-person evaluation if the symptoms worsen or if the condition fails to improve as anticipated.  I provided 16 minutes of non-face-to-face time during this encounter.  Reynolds Bowl, PA-C, have reviewed all documentation for  this visit. The documentation on 08/06/20 for the exam, diagnosis, procedures, and orders are all accurate and complete.  [place] Arthur [phone] (phone) [phone] (fax)  [place]

## 2020-08-06 NOTE — Progress Notes (Signed)
Patient was notified x 2 with provider waiting 12 minutes, then called without answer. Marked NOS

## 2020-08-14 MED ORDER — DOXYCYCLINE HYCLATE 100 MG PO TABS: 100.0000 mg | ORAL_TABLET | Freq: Two times a day (BID) | ORAL | 0 refills | Status: DC

## 2020-08-14 NOTE — Addendum Note (Signed)
Addended by: Mar Daring on: 08/14/2020 01:49 PM   Modules accepted: Orders

## 2020-10-26 ENCOUNTER — Encounter: Payer: Self-pay | Admitting: Internal Medicine

## 2020-12-14 IMAGING — US US MFM OB FOLLOW-UP
1 series · 13 of 28 positions shown · non-contrast
Comparison: none

[Series 1: us mfm ob follow-up · 58 acquisitions, 13 frames shown]
[im 3/58]
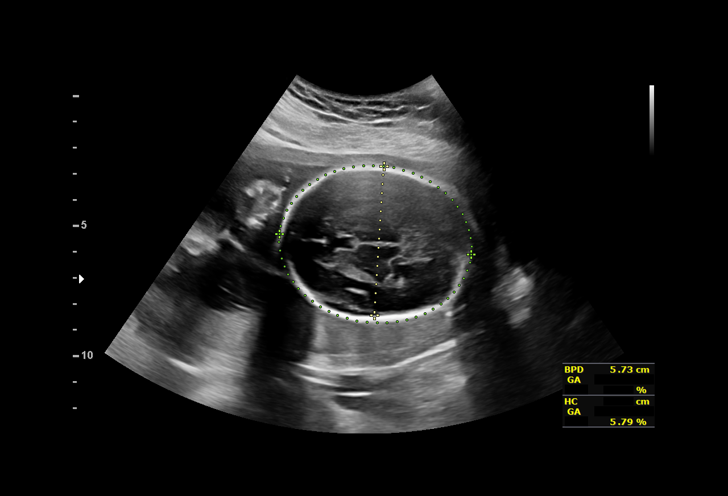
[im 7/58]
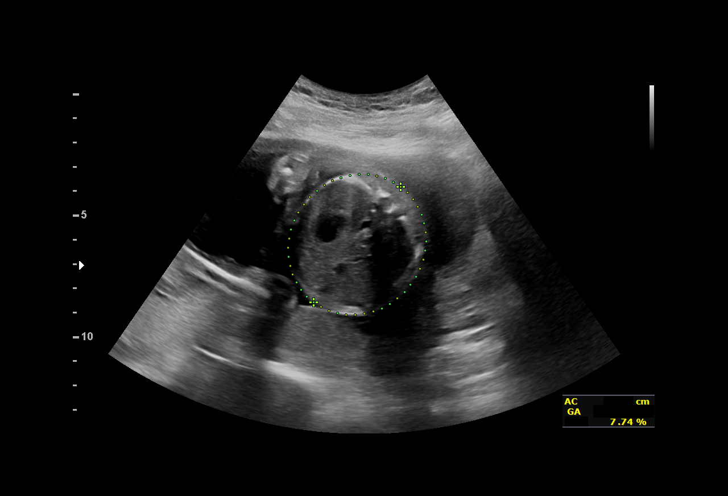
[im 11/58]
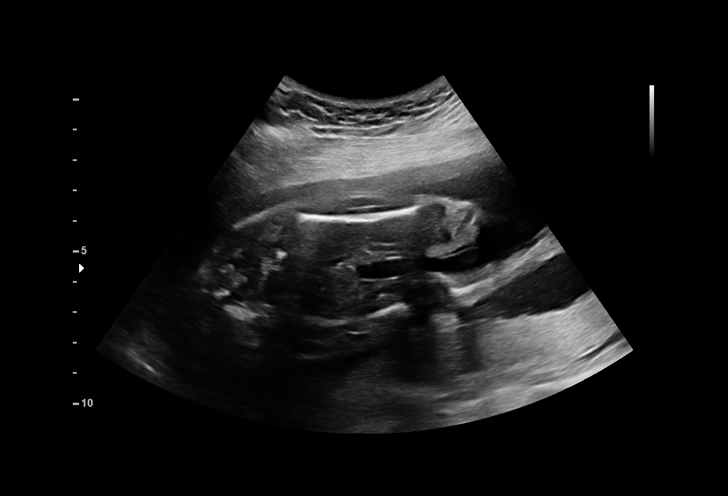
[im 15/58]
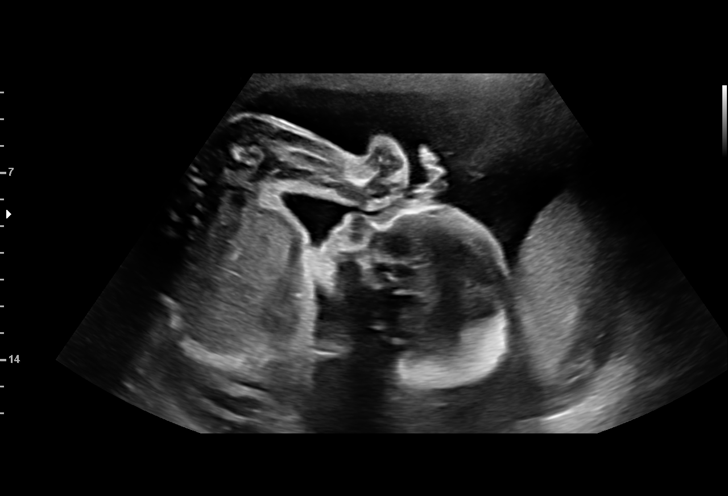
[im 20/58]
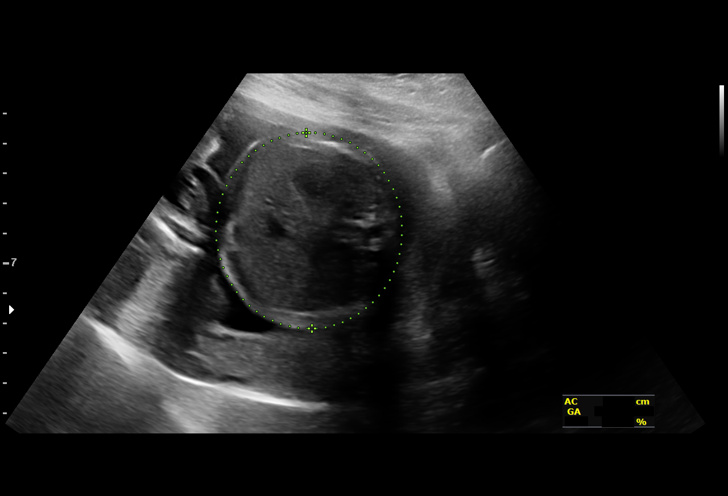
[im 24/58]
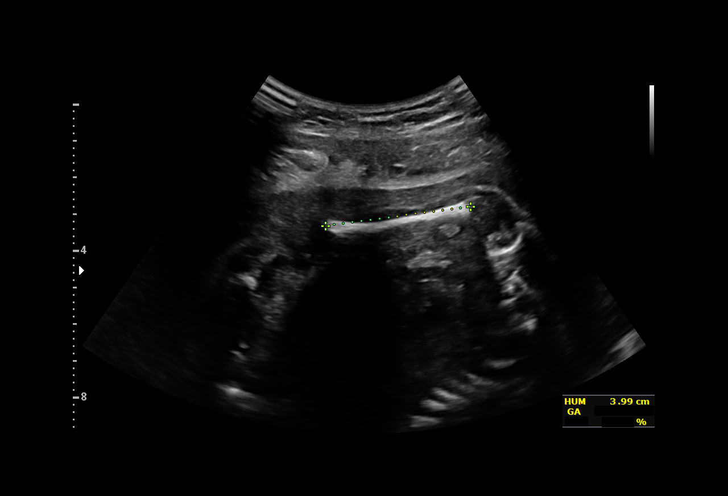
[im 30/58]
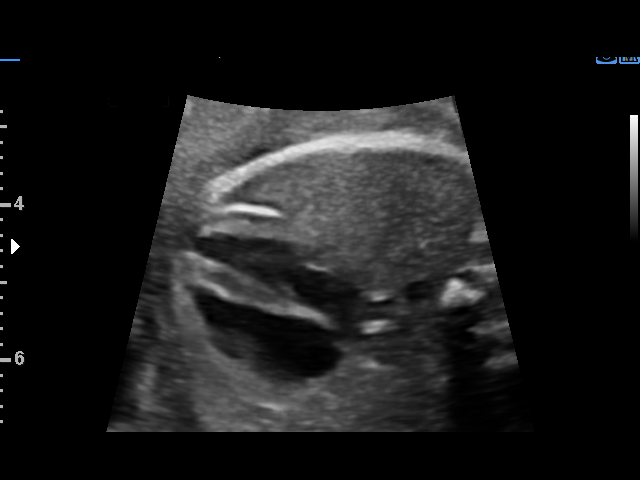
[im 34/58]
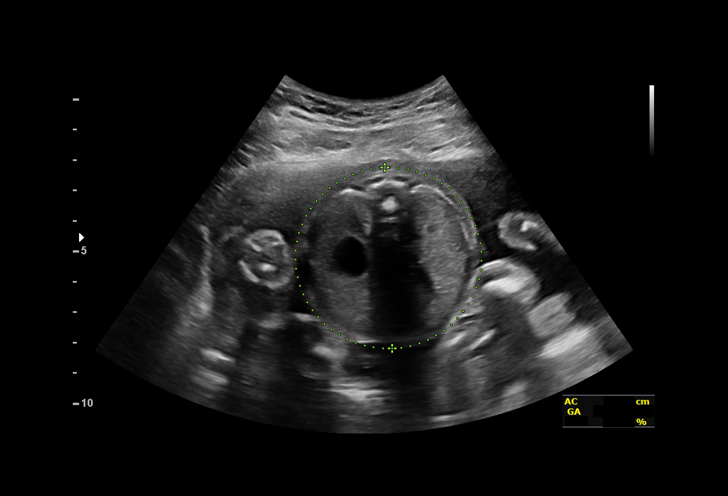
[im 39/58]
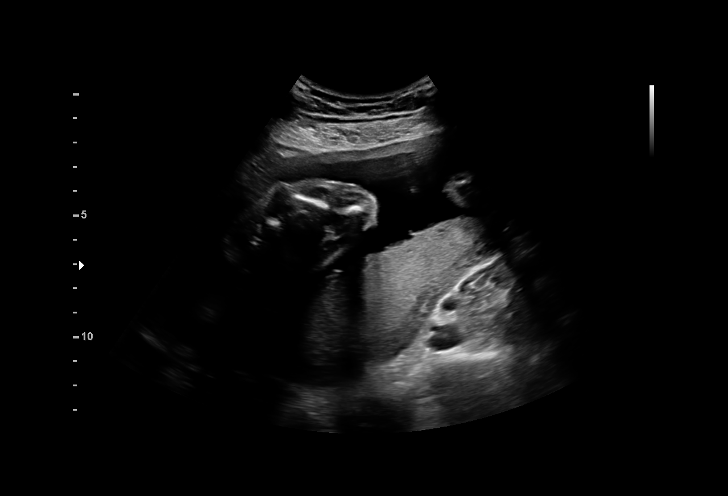
[im 43/58]
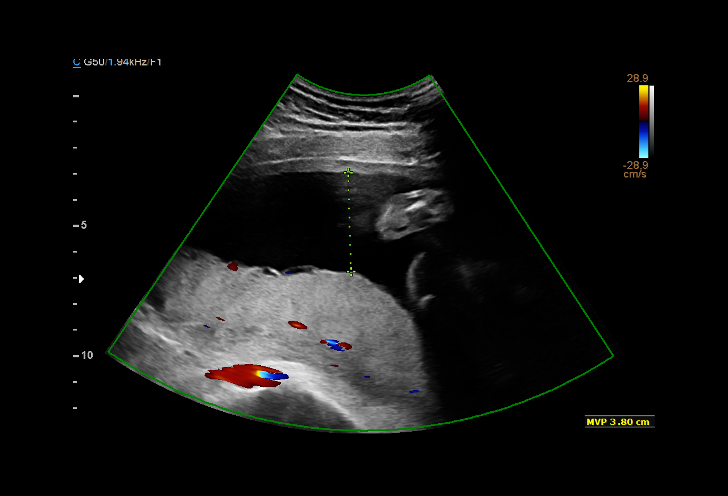
[im 47/58]
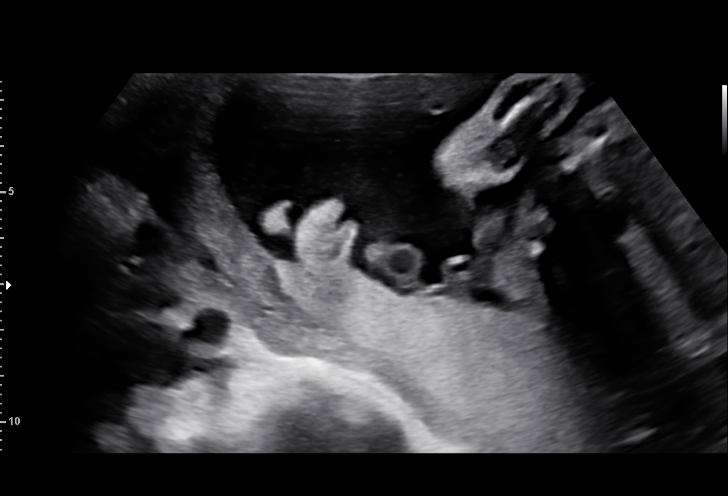
[im 51/58]
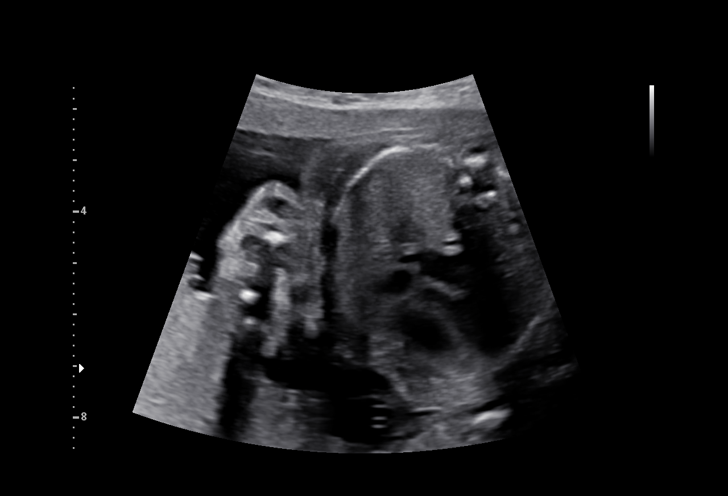
[im 55/58]
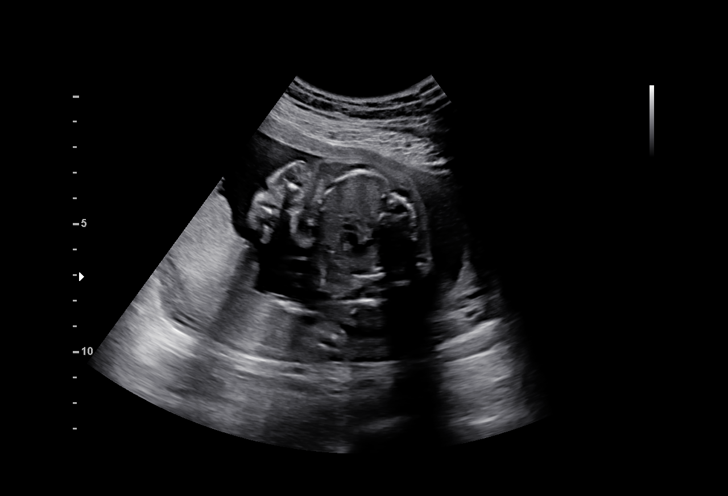

[13 of 28 positions shown; findings below may reference images not displayed]

OB/GYN &
                                                            Infertility
                                                            [id number] [HOSPITAL]

 ----------------------------------------------------------------------

 ----------------------------------------------------------------------
Indications

  Poor obstetric history: Previous
  preeclampsia / eclampsia/gestational HTN
  Medical complication of pregnancy (chronic
  kidney disease)
  Poor obstetric history: Previous preterm
  delivery, antepartum (@25 wks, c/s due to
  pre-ec)
  Encounter for other antenatal screening
  follow-up (low risk NIPS)
  24 weeks gestation of pregnancy
  Previous cesarean delivery, antepartum
 ----------------------------------------------------------------------
Fetal Evaluation

 Num Of Fetuses:         1
 Fetal Heart Rate(bpm):  157
 Cardiac Activity:       Observed
 Presentation:           Transverse, head to maternal left
 Placenta:               Left lateral
 P. Cord Insertion:      Previously Visualized

 Amniotic Fluid
 AFI FV:      Within normal limits

                             Largest Pocket(cm)

Biometry
 BPD:        57  mm     G. Age:  23w 3d         19  %    CI:        71.93   %    70 - 86
                                                         FL/HC:      18.5   %    18.7 -
 HC:      213.9  mm     G. Age:  23w 3d         11  %    HC/AC:      1.05        1.05 -
 AC:      203.1  mm     G. Age:  24w 6d         66  %    FL/BPD:     69.3   %    71 - 87
 FL:       39.5  mm     G. Age:  22w 5d          6  %    FL/AC:      19.4   %    20 - 24
 HUM:      38.8  mm     G. Age:  23w 6d         33  %
 LV:        3.5  mm

 Est. FW:     640  gm      1 lb 7 oz     30  %
OB History

 Gravidity:    3         Term:   0        Prem:   1        SAB:   0
 TOP:          1       Ectopic:  0        Living: 1
Gestational Age

 LMP:           24w 6d        Date:  09/01/18                 EDD:   06/08/19
 U/S Today:     23w 4d                                        EDD:   06/17/19
 Best:          24w 1d     Det. By:  Early Ultrasound         EDD:   06/13/19
                                     (10/26/18)
Anatomy

 Cranium:               Appears normal         Aortic Arch:            Appears normal
 Cavum:                 Appears normal         Ductal Arch:            Previously seen
 Ventricles:            Appears normal         Diaphragm:              Appears normal
 Choroid Plexus:        Previously seen        Stomach:                Appears normal, left
                                                                       sided
 Cerebellum:            Previously seen        Abdomen:                Previously seen
 Posterior Fossa:       Previously seen        Abdominal Wall:         Previously seen
 Nuchal Fold:           Previously seen        Cord Vessels:           Previously seen
 Face:                  Orbits and profile     Kidneys:                Appear normal
                        previously seen
 Lips:                  Previously seen        Bladder:                Appears normal
 Thoracic:              Appears normal         Spine:                  Previously seen
 Heart:                 Previously seen        Upper Extremities:      Previously seen
 RVOT:                  Previously seen        Lower Extremities:      Previously seen
 LVOT:                  Previously seen

 Other:  Fetus appears to be a male. Heels prev visualized. Technically
         difficult due to fetal position.
Cervix Uterus Adnexa

 Cervix
 Not visualized (advanced GA >15wks)
Comments

 This patient was seen for a follow up growth scan due to a
 history of chronic kidney disease, chronic hypertension
 currently treated with labetalol, and history of severe
 preeclampsia requiring a 25-week classical cesarean delivery
 in her last pregnancy.  The patient reports that her dosage of
 labetalol was recently decreased to 100 mg 3 times a day.
 She reports that she has been doing well and denies any
 problems since her last exam.
 She was informed that the fetal growth and amniotic fluid
 level appears appropriate for her gestational age.
 Due to her significant history, we will continue to follow her
 with serial growth ultrasounds.
 A follow-up exam was scheduled in 4 weeks.

## 2021-03-05 ENCOUNTER — Encounter: Payer: Self-pay | Admitting: Internal Medicine
